# Patient Record
Sex: Female | Born: 1990
Health system: Southern US, Community
[De-identification: ages and names within clinical notes are randomized; demographics above are authoritative.]

## PROBLEM LIST (undated history)

## (undated) DIAGNOSIS — F99 Mental disorder, not otherwise specified: Secondary | ICD-10-CM

## (undated) DIAGNOSIS — F309 Manic episode, unspecified: Secondary | ICD-10-CM

## (undated) DIAGNOSIS — J45909 Unspecified asthma, uncomplicated: Secondary | ICD-10-CM

## (undated) DIAGNOSIS — F32A Depression, unspecified: Secondary | ICD-10-CM

## (undated) HISTORY — PX: UMBILICAL HERNIA REPAIR: SHX196

## (undated) HISTORY — PX: TONSILECTOMY, ADENOIDECTOMY, BILATERAL MYRINGOTOMY AND TUBES: SHX2538

---
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
## 2009-11-13 ENCOUNTER — Ambulatory Visit (HOSPITAL_COMMUNITY): Admission: RE | Admit: 2009-11-13 | Discharge: 2009-11-13 | Payer: Self-pay | Admitting: Family Medicine

## 2009-11-13 ENCOUNTER — Emergency Department (HOSPITAL_COMMUNITY): Admission: EM | Admit: 2009-11-13 | Discharge: 2009-11-13 | Payer: Self-pay | Admitting: Family Medicine

## 2010-01-05 ENCOUNTER — Inpatient Hospital Stay (HOSPITAL_COMMUNITY): Admission: AD | Admit: 2010-01-05 | Discharge: 2010-01-05 | Payer: Self-pay | Admitting: Obstetrics and Gynecology

## 2010-03-19 ENCOUNTER — Inpatient Hospital Stay (HOSPITAL_COMMUNITY)
Admission: AD | Admit: 2010-03-19 | Discharge: 2010-03-20 | Payer: Self-pay | Source: Home / Self Care | Attending: Obstetrics & Gynecology | Admitting: Obstetrics & Gynecology

## 2010-05-28 LAB — URINALYSIS, ROUTINE W REFLEX MICROSCOPIC
Glucose, UA: NEGATIVE mg/dL
Ketones, ur: NEGATIVE mg/dL
Leukocytes, UA: NEGATIVE
Urobilinogen, UA: 1 mg/dL (ref 0.0–1.0)
pH: 6 (ref 5.0–8.0)

## 2010-05-28 LAB — URINE MICROSCOPIC-ADD ON

## 2010-05-30 LAB — URINALYSIS, ROUTINE W REFLEX MICROSCOPIC
Bilirubin Urine: NEGATIVE
Glucose, UA: NEGATIVE mg/dL
Hgb urine dipstick: NEGATIVE
Ketones, ur: 15 mg/dL — AB

## 2010-05-30 LAB — POCT PREGNANCY, URINE: Preg Test, Ur: NEGATIVE

## 2010-06-01 LAB — POCT PREGNANCY, URINE: Preg Test, Ur: NEGATIVE

## 2010-06-01 LAB — POCT URINALYSIS DIPSTICK
Glucose, UA: NEGATIVE mg/dL
Nitrite: NEGATIVE
pH: 6.5 (ref 5.0–8.0)

## 2010-07-16 ENCOUNTER — Inpatient Hospital Stay (HOSPITAL_COMMUNITY)
Admission: AD | Admit: 2010-07-16 | Discharge: 2010-07-17 | Disposition: A | Discharge status: Home / Self Care | Payer: Self-pay | Source: Ambulatory Visit | Attending: Obstetrics & Gynecology | Admitting: Obstetrics & Gynecology

## 2010-07-16 DIAGNOSIS — A499 Bacterial infection, unspecified: Secondary | ICD-10-CM | POA: Insufficient documentation

## 2010-07-16 DIAGNOSIS — N760 Acute vaginitis: Secondary | ICD-10-CM | POA: Insufficient documentation

## 2010-07-16 DIAGNOSIS — N949 Unspecified condition associated with female genital organs and menstrual cycle: Secondary | ICD-10-CM

## 2010-07-16 DIAGNOSIS — B9689 Other specified bacterial agents as the cause of diseases classified elsewhere: Secondary | ICD-10-CM | POA: Insufficient documentation

## 2010-07-16 LAB — POCT PREGNANCY, URINE: Preg Test, Ur: NEGATIVE

## 2010-07-17 LAB — URINALYSIS, ROUTINE W REFLEX MICROSCOPIC
Glucose, UA: NEGATIVE mg/dL
Hgb urine dipstick: NEGATIVE
Ketones, ur: NEGATIVE mg/dL
Specific Gravity, Urine: >1.03 — ABNORMAL HIGH (ref 1.005–1.030)

## 2010-07-17 LAB — WET PREP, GENITAL: Trich, Wet Prep: NONE SEEN

## 2010-07-17 LAB — GC/CHLAMYDIA PROBE AMP, GENITAL
Chlamydia, DNA Probe: NEGATIVE
GC Probe Amp, Genital: NEGATIVE

## 2010-07-27 ENCOUNTER — Inpatient Hospital Stay (HOSPITAL_COMMUNITY)
Admission: AD | Admit: 2010-07-27 | Discharge: 2010-07-27 | Disposition: A | Discharge status: Home / Self Care | Payer: Self-pay | Source: Ambulatory Visit | Attending: Obstetrics and Gynecology | Admitting: Obstetrics and Gynecology

## 2010-07-27 DIAGNOSIS — R109 Unspecified abdominal pain: Secondary | ICD-10-CM

## 2010-07-27 DIAGNOSIS — N760 Acute vaginitis: Secondary | ICD-10-CM

## 2010-07-27 DIAGNOSIS — A499 Bacterial infection, unspecified: Secondary | ICD-10-CM

## 2010-07-27 DIAGNOSIS — K5900 Constipation, unspecified: Secondary | ICD-10-CM | POA: Insufficient documentation

## 2010-07-27 DIAGNOSIS — B9689 Other specified bacterial agents as the cause of diseases classified elsewhere: Secondary | ICD-10-CM | POA: Insufficient documentation

## 2010-07-27 LAB — URINALYSIS, ROUTINE W REFLEX MICROSCOPIC
Glucose, UA: NEGATIVE mg/dL
Hgb urine dipstick: NEGATIVE
Specific Gravity, Urine: 1.02 (ref 1.005–1.030)
pH: 7 (ref 5.0–8.0)

## 2010-07-27 LAB — CBC
Hemoglobin: 11.4 g/dL — ABNORMAL LOW (ref 12.0–15.0)
MCH: 31.1 pg (ref 26.0–34.0)
MCV: 96.4 fL (ref 78.0–100.0)
RBC: 3.66 MIL/uL — ABNORMAL LOW (ref 3.87–5.11)
WBC: 5.9 10*3/uL (ref 4.0–10.5)

## 2010-07-27 LAB — DIFFERENTIAL
Basophils Relative: 0 % (ref 0–1)
Lymphs Abs: 3.4 10*3/uL (ref 0.7–4.0)
Monocytes Relative: 9 % (ref 3–12)
Neutro Abs: 1.8 10*3/uL (ref 1.7–7.7)
Neutrophils Relative %: 31 % — ABNORMAL LOW (ref 43–77)
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
## 2010-08-03 NOTE — Discharge Summary (Signed)
Christina David, Christina David             ACCOUNT NO.:  [id number]   MEDICAL RECORD NO.:  [id number]          PATIENT TYPE:  EMS   LOCATION:  ED                           FACILITY[facility]  [facility]   PHYSICIAN:  Dyann Ruddle, MDDATE OF BIRTH:  14-Jun-1990   DATE OF ADMISSION:  03/19/2006  DATE OF DISCHARGE:  03/20/2006                               DISCHARGE SUMMARY   REASON FOR HOSPITALIZATION:  Pelvic inflammatory disease with positive  Gonorrhea, possible urinary tract infection.   SIGNIFICANT FINDINGS:  [age]-year-old female with history of bipolar  disease and asthma, presenting with 2 week history of lower abdominal  and pelvic pain.  Prior visits to the emergency department revealed a CT  scan from March 11, 2006, which showed mild colonic wall thickening,  but without pelvic abnormality.  GC probe from March 14, 2006  returned positive for [facility] and negative for Chlamydia.  Urinalysis on  March 19, 2006, positive leukocyte esterase and positive nitrite with  30 protein.   TREATMENT:  Monitored abdominal pain, plus strict measurement of ins and  outs.  Continued home Flagyl prescription and home Cipro prescription  for pelvic inflammatory disease treatment.  IV fluids for hydration on  admission.   OPERATION/PROCEDURE:  None.   FINAL DIAGNOSES:  1. Pelvic inflammatory disease secondary to Gonorrhea.  2. Possible urinary tract infection.   DISCHARGE MEDICATIONS AND INSTRUCTIONS:  1. Flagyl 500 mg p.o. b.i.d. for 8 days.  2. Cipro 500 mg p.o. b.i.d. for 8 days.  3. Vicodin 5/500 one tab p.o. q.6 hours p.r.n.   PENDING RESULTS AND ISSUES TO BE FOLLOWED:  Urine culture, RPR and HIV  test.   FOLLOWUP:  [facility], scheduled for March 21, 2006 at 9:45 a.m.   DISCHARGE WEIGHT:  68 kilograms.   DISCHARGE CONDITION:  Improved.          ______________________________  Dyann Ruddle, MD    LSP/MEDQ  D:  03/20/2006  T:  03/20/2006  Job:  [id number]   cc:   [facility]

## 2010-09-08 ENCOUNTER — Inpatient Hospital Stay (HOSPITAL_COMMUNITY)
Admission: AD | Admit: 2010-09-08 | Discharge: 2010-09-08 | Disposition: A | Discharge status: Home / Self Care | Payer: Self-pay | Source: Ambulatory Visit | Attending: Obstetrics & Gynecology | Admitting: Obstetrics & Gynecology

## 2010-09-08 DIAGNOSIS — N898 Other specified noninflammatory disorders of vagina: Secondary | ICD-10-CM

## 2010-09-08 DIAGNOSIS — F319 Bipolar disorder, unspecified: Secondary | ICD-10-CM | POA: Insufficient documentation

## 2010-09-08 DIAGNOSIS — L03119 Cellulitis of unspecified part of limb: Secondary | ICD-10-CM | POA: Insufficient documentation

## 2010-09-08 DIAGNOSIS — L0231 Cutaneous abscess of buttock: Secondary | ICD-10-CM

## 2010-09-08 DIAGNOSIS — L02419 Cutaneous abscess of limb, unspecified: Secondary | ICD-10-CM

## 2010-09-08 DIAGNOSIS — L03317 Cellulitis of buttock: Secondary | ICD-10-CM

## 2010-09-08 DIAGNOSIS — J45909 Unspecified asthma, uncomplicated: Secondary | ICD-10-CM | POA: Insufficient documentation

## 2010-09-08 LAB — WET PREP, GENITAL: Yeast Wet Prep HPF POC: NONE SEEN

## 2010-09-10 LAB — POCT PREGNANCY, URINE: Preg Test, Ur: NEGATIVE

## 2010-10-31 ENCOUNTER — Inpatient Hospital Stay (HOSPITAL_COMMUNITY)
Admission: RE | Admit: 2010-10-31 | Discharge: 2010-10-31 | Disposition: A | Discharge status: Home / Self Care | Payer: Medicaid Other | Source: Ambulatory Visit | Attending: Family Medicine | Admitting: Family Medicine

## 2010-11-15 ENCOUNTER — Inpatient Hospital Stay (HOSPITAL_COMMUNITY)
Admission: AD | Admit: 2010-11-15 | Discharge: 2010-11-15 | Discharge status: Against Medical Advice | Payer: Medicaid Other | Source: Ambulatory Visit | Attending: Obstetrics & Gynecology | Admitting: Obstetrics & Gynecology

## 2010-11-15 ENCOUNTER — Encounter (HOSPITAL_COMMUNITY): Payer: Self-pay | Admitting: *Deleted

## 2010-11-15 DIAGNOSIS — B9689 Other specified bacterial agents as the cause of diseases classified elsewhere: Secondary | ICD-10-CM | POA: Insufficient documentation

## 2010-11-15 DIAGNOSIS — A499 Bacterial infection, unspecified: Secondary | ICD-10-CM | POA: Insufficient documentation

## 2010-11-15 DIAGNOSIS — N760 Acute vaginitis: Secondary | ICD-10-CM | POA: Insufficient documentation

## 2010-11-15 LAB — URINALYSIS, ROUTINE W REFLEX MICROSCOPIC
Bilirubin Urine: NEGATIVE
Glucose, UA: NEGATIVE mg/dL
Ketones, ur: NEGATIVE mg/dL
Leukocytes, UA: NEGATIVE
Protein, ur: NEGATIVE mg/dL

## 2010-11-15 LAB — WET PREP, GENITAL

## 2010-11-15 LAB — CBC
HCT: 34.8 % — ABNORMAL LOW (ref 36.0–46.0)
Hemoglobin: 11.4 g/dL — ABNORMAL LOW (ref 12.0–15.0)
MCH: 31.5 pg (ref 26.0–34.0)
MCHC: 32.8 g/dL (ref 30.0–36.0)
MCV: 96.1 fL (ref 78.0–100.0)
RDW: 12.7 % (ref 11.5–15.5)

## 2010-11-15 LAB — URINE MICROSCOPIC-ADD ON

## 2010-11-15 LAB — GC/CHLAMYDIA PROBE AMP, GENITAL: Chlamydia, DNA Probe: NEGATIVE

## 2010-11-15 LAB — POCT PREGNANCY, URINE: Preg Test, Ur: NEGATIVE

## 2010-11-15 MED ORDER — ONDANSETRON 8 MG PO TBDP
8.0000 mg | ORAL_TABLET | Freq: Once | ORAL | Status: DC
  Filled 2010-11-15: qty 1

## 2010-11-15 MED ORDER — IBUPROFEN 600 MG PO TABS
600.0000 mg | ORAL_TABLET | Freq: Once | ORAL | Status: AC
  Administered 2010-11-15: 600 mg via ORAL
  Filled 2010-11-15: qty 1

## 2010-11-15 NOTE — ED Provider Notes (Signed)
History     Chief Complaint  Patient presents with  . Vaginal Bleeding   HPI Bleeding all month, spotting and clots, nausea and burping 2-3 times/day for 2 weeks. Headache x 1.5 hours. Hasn't tried any meds for nausea or headache. Had rice, shrimp, steak and chicken hibachi tonight. Drinking water. Periods usually regular.   OB History    Grav Para Term Preterm Abortions TAB SAB Ect Mult Living   0               No past medical history on file.  Past Surgical History  Procedure Date  . Umbilical hernia repair   . Tonsilectomy, adenoidectomy, bilateral myringotomy and tubes     Family History  Problem Relation Age of Onset  . Hypertension Mother     History  Substance Use Topics  . Smoking status: Current Some Day Smoker -- 1.0 packs/day  . Smokeless tobacco: Not on file  . Alcohol Use: No    Allergies: Allergies not on file  No prescriptions prior to admission    Review of Systems  Constitutional: Negative.   Respiratory: Negative.   Cardiovascular: Negative.   Gastrointestinal: Positive for nausea. Negative for vomiting, abdominal pain, diarrhea and constipation.  Genitourinary: Negative for dysuria, urgency, frequency, hematuria and flank pain.       Negative vaginal discharge, dyspareunia, positive for spotting   Musculoskeletal: Negative.   Neurological: Negative.   Psychiatric/Behavioral: Negative.    Physical Exam   Blood pressure 121/80, pulse 67, temperature 98.8 F (37.1 C), resp. rate 20, height 5\' 5"  (1.651 m), weight 77.565 kg (171 lb), last menstrual period 10/17/2010.  Physical Exam  Constitutional: She is oriented to person, place, and time. She appears well-developed and well-nourished. No distress.  HENT:  Head: Normocephalic and atraumatic.  Cardiovascular: Normal rate, regular rhythm and normal heart sounds.   Respiratory: Effort normal and breath sounds normal. No respiratory distress.  GI: Soft. Bowel sounds are normal. She exhibits  no distension and no mass. There is no tenderness. There is no rebound and no guarding.  Genitourinary: There is no rash or lesion on the right labia. There is no rash or lesion on the left labia. Uterus is not deviated, not enlarged, not fixed and not tender. Cervix exhibits no motion tenderness, no discharge and no friability. Right adnexum displays no mass, no tenderness and no fullness. Left adnexum displays no mass, no tenderness and no fullness. No erythema, tenderness or bleeding around the vagina. No vaginal discharge found.  Neurological: She is alert and oriented to person, place, and time.  Skin: Skin is warm and dry.  Psychiatric: She has a normal mood and affect.    MAU Course  Procedures  Results for orders placed during the hospital encounter of 11/15/10 (from the past 24 hour(s))  URINALYSIS, ROUTINE W REFLEX MICROSCOPIC     Status: Abnormal   Collection Time   11/15/10  1:15 AM      Component Value Range   Color, Urine YELLOW  YELLOW    Appearance CLEAR  CLEAR    Specific Gravity, Urine 1.020  1.005 - 1.030    pH 7.0  5.0 - 8.0    Glucose, UA NEGATIVE  NEGATIVE (mg/dL)   Hgb urine dipstick TRACE (*) NEGATIVE    Bilirubin Urine NEGATIVE  NEGATIVE    Ketones, ur NEGATIVE  NEGATIVE (mg/dL)   Protein, ur NEGATIVE  NEGATIVE (mg/dL)   Urobilinogen, UA 1.0  0.0 - 1.0 (mg/dL)  Nitrite NEGATIVE  NEGATIVE    Leukocytes, UA NEGATIVE  NEGATIVE   URINE MICROSCOPIC-ADD ON     Status: Abnormal   Collection Time   11/15/10  1:15 AM      Component Value Range   Squamous Epithelial / LPF FEW (*) RARE    WBC, UA 3-6  <3 (WBC/hpf)   RBC / HPF 0-2  <3 (RBC/hpf)   Bacteria, UA RARE  RARE    Urine-Other MUCOUS PRESENT    POCT PREGNANCY, URINE     Status: Normal   Collection Time   11/15/10  1:32 AM      Component Value Range   Preg Test, Ur NEGATIVE    CBC     Status: Abnormal   Collection Time   11/15/10  1:35 AM      Component Value Range   WBC 7.7  4.0 - 10.5 (K/uL)   RBC 3.62  (*) 3.87 - 5.11 (MIL/uL)   Hemoglobin 11.4 (*) 12.0 - 15.0 (g/dL)   HCT 40.9 (*) 81.1 - 46.0 (%)   MCV 96.1  78.0 - 100.0 (fL)   MCH 31.5  26.0 - 34.0 (pg)   MCHC 32.8  30.0 - 36.0 (g/dL)   RDW 91.4  78.2 - 95.6 (%)   Platelets 211  150 - 400 (K/uL)    Motrin ordered for headache, Zofran ODT for nausea   Assessment and Plan  20 y.o. female with irregular bleeding and BV Pt left AMA  prior to wet prep results and prior to getting Zofran  FRAZIER,NATALIE 11/15/2010, 1:36 AM

## 2010-11-15 NOTE — Progress Notes (Signed)
Pt states she thinks she had a regular period at the beginning of Aug.-states has been bleeding all month and has been nauseated-uses no birth control-states she is unable to gett pregnant-does not know why

## 2010-11-15 NOTE — Progress Notes (Signed)
Pt states she has been bleeding for 3 wks.Pt states her bleeding is spotting and clots. Pt denies cramping.

## 2011-01-03 LAB — POCT URINALYSIS DIP (DEVICE)
Bilirubin Urine: NEGATIVE
Glucose, UA: NEGATIVE
Ketones, ur: 15 — AB
Nitrite: NEGATIVE
Specific Gravity, Urine: 1.015
pH: 6.5

## 2011-01-03 LAB — DIFFERENTIAL
Basophils Absolute: 0
Basophils Relative: 0
Eosinophils Absolute: 0.1
Eosinophils Relative: 1
Lymphocytes Relative: 27
Lymphs Abs: 2.4
Monocytes Absolute: 1
Monocytes Relative: 11 — ABNORMAL HIGH
Neutro Abs: 5.3
Neutrophils Relative %: 61

## 2011-01-03 LAB — CBC
HCT: 33.7 — ABNORMAL LOW
Hemoglobin: 11.2 — ABNORMAL LOW
MCHC: 33.2
MCV: 93.4
Platelets: 314
RBC: 3.61 — ABNORMAL LOW
RDW: 13.3
WBC: 8.8

## 2011-01-03 LAB — GC/CHLAMYDIA PROBE AMP, GENITAL
Chlamydia, DNA Probe: POSITIVE — AB
GC Probe Amp, Genital: NEGATIVE

## 2011-01-03 LAB — WET PREP, GENITAL
Trich, Wet Prep: NONE SEEN
Yeast Wet Prep HPF POC: NONE SEEN

## 2011-01-03 LAB — POCT PREGNANCY, URINE
Operator id: 235561
Preg Test, Ur: NEGATIVE

## 2011-01-21 ENCOUNTER — Encounter: Payer: Medicaid Other | Admitting: Obstetrics and Gynecology

## 2011-12-30 ENCOUNTER — Inpatient Hospital Stay (HOSPITAL_COMMUNITY)
Admission: AD | Admit: 2011-12-30 | Discharge: 2011-12-30 | Disposition: A | Discharge status: Home / Self Care | Payer: Medicaid Other | Source: Ambulatory Visit | Attending: Obstetrics and Gynecology | Admitting: Obstetrics and Gynecology

## 2011-12-30 DIAGNOSIS — N938 Other specified abnormal uterine and vaginal bleeding: Secondary | ICD-10-CM | POA: Insufficient documentation

## 2011-12-30 DIAGNOSIS — N921 Excessive and frequent menstruation with irregular cycle: Secondary | ICD-10-CM

## 2011-12-30 DIAGNOSIS — N923 Ovulation bleeding: Secondary | ICD-10-CM

## 2011-12-30 DIAGNOSIS — N949 Unspecified condition associated with female genital organs and menstrual cycle: Secondary | ICD-10-CM | POA: Insufficient documentation

## 2011-12-30 LAB — URINALYSIS, ROUTINE W REFLEX MICROSCOPIC
Ketones, ur: 15 mg/dL — AB
Leukocytes, UA: NEGATIVE
Nitrite: NEGATIVE
Protein, ur: 30 mg/dL — AB

## 2011-12-30 LAB — URINE MICROSCOPIC-ADD ON

## 2011-12-30 LAB — WET PREP, GENITAL: Yeast Wet Prep HPF POC: NONE SEEN

## 2011-12-30 LAB — POCT PREGNANCY, URINE: Preg Test, Ur: NEGATIVE

## 2011-12-30 NOTE — MAU Provider Note (Signed)
History     CSN: [id number]  Arrival date and time: 12/30/11 1641   None     Chief Complaint  Patient presents with  . Vaginal Bleeding   HPI 21 y.o. G0P0 with intermenstrual bleeding for the last 2 months. Patient's last menstrual period was 12/15/2011. Bled x 4 days, then stopped, started spotting again on 10/10. Same pattern last month. No pain.    No past medical history on file.  Past Surgical History  Procedure Date  . Umbilical hernia repair   . Tonsilectomy, adenoidectomy, bilateral myringotomy and tubes     Family History  Problem Relation Age of Onset  . Hypertension Mother     History  Substance Use Topics  . Smoking status: Current Some Day Smoker -- 1.0 packs/day  . Smokeless tobacco: Not on file  . Alcohol Use: No    Allergies:  Allergies  Allergen Reactions  . Trileptal (Oxcarbazepine)     Hives, swelling, and hadaches    Prescriptions prior to admission  Medication Sig Dispense Refill  . Aspirin-Salicylamide-Caffeine (BC HEADACHE POWDER PO) Take by mouth. Headache        Review of Systems  Constitutional: Negative.   Respiratory: Negative.   Cardiovascular: Negative.   Gastrointestinal: Negative for nausea, vomiting, abdominal pain, diarrhea and constipation.  Genitourinary: Negative for dysuria, urgency, frequency, hematuria and flank pain.       Positive for spotting  Musculoskeletal: Negative.   Neurological: Negative.   Psychiatric/Behavioral: Negative.    Physical Exam   Blood pressure 116/64, pulse 73, temperature 98.2 F (36.8 C), temperature source Oral, resp. rate 18, height 5\' 5"  (1.651 m), weight 166 lb 12.8 oz (75.66 kg), last menstrual period 12/15/2011.  Physical Exam  Nursing note and vitals reviewed. Constitutional: She is oriented to person, place, and time. She appears well-developed and well-nourished. No distress.  HENT:  Head: Normocephalic and atraumatic.  Cardiovascular: Normal rate and regular rhythm.     Respiratory: Effort normal. No respiratory distress.  GI: Soft. Bowel sounds are normal. She exhibits no distension and no mass. There is no tenderness. There is no rebound and no guarding.  Genitourinary: There is no rash or lesion on the right labia. There is no rash or lesion on the left labia. Uterus is not deviated, not enlarged, not fixed and not tender. Cervix exhibits discharge (blood tinged egg white cervical mucous). Cervix exhibits no motion tenderness and no friability. Right adnexum displays no mass, no tenderness and no fullness. Left adnexum displays no mass, no tenderness and no fullness. No erythema or tenderness around the vagina. No vaginal discharge found.  Neurological: She is alert and oriented to person, place, and time.  Skin: Skin is warm and dry.  Psychiatric: She has a normal mood and affect.    MAU Course  Procedures  Results for orders placed during the hospital encounter of 12/30/11 (from the past 24 hour(s))  URINALYSIS, ROUTINE W REFLEX MICROSCOPIC     Status: Abnormal   Collection Time   12/30/11  4:55 PM      Component Value Range   Color, Urine YELLOW  YELLOW   APPearance CLEAR  CLEAR   Specific Gravity, Urine 1.025  1.005 - 1.030   pH 6.5  5.0 - 8.0   Glucose, UA NEGATIVE  NEGATIVE mg/dL   Hgb urine dipstick NEGATIVE  NEGATIVE   Bilirubin Urine NEGATIVE  NEGATIVE   Ketones, ur 15 (*) NEGATIVE mg/dL   Protein, ur 30 (*) NEGATIVE mg/dL  Urobilinogen, UA 1.0  0.0 - 1.0 mg/dL   Nitrite NEGATIVE  NEGATIVE   Leukocytes, UA NEGATIVE  NEGATIVE  URINE MICROSCOPIC-ADD ON     Status: Normal   Collection Time   12/30/11  4:55 PM      Component Value Range   Squamous Epithelial / LPF RARE  RARE   WBC, UA 0-2  <3 WBC/hpf   RBC / HPF 0-2  <3 RBC/hpf   Bacteria, UA RARE  RARE   Urine-Other MUCOUS PRESENT    POCT PREGNANCY, URINE     Status: Normal   Collection Time   12/30/11  5:14 PM      Component Value Range   Preg Test, Ur NEGATIVE  NEGATIVE  WET  PREP, GENITAL     Status: Abnormal   Collection Time   12/30/11  5:52 PM      Component Value Range   Yeast Wet Prep HPF POC NONE SEEN  NONE SEEN   Trich, Wet Prep NONE SEEN  NONE SEEN   Clue Cells Wet Prep HPF POC FEW (*) NONE SEEN   WBC, Wet Prep HPF POC FEW (*) NONE SEEN     Assessment and Plan   1. Intermenstrual bleeding       Medication List     As of 01/01/2012  3:45 PM    CONTINUE taking these medications         BC HEADACHE POWDER PO            Follow-up Information    Follow up with [facility]. (As needed)    Contact information:   [address] [place] [address] [phone]           Florencia Zaccaro 12/30/2011, 5:24 PM

## 2011-12-30 NOTE — MAU Note (Signed)
States has been bleeding for 3-4 days in addition to her period which ended 10/4. States she did this last month also. States she has never been pregnant and has not been using birth control.

## 2012-01-01 NOTE — MAU Provider Note (Signed)
Attestation of Attending Supervision of Advanced Practitioner (CNM/NP): Evaluation and management procedures were performed by the Advanced Practitioner under my supervision and collaboration.  I have reviewed the Advanced Practitioner's note and chart, and I agree with the management and plan.  [name] 01/01/2012 4:05 PM

## 2012-04-10 ENCOUNTER — Emergency Department (HOSPITAL_COMMUNITY)
Admission: EM | Admit: 2012-04-10 | Discharge: 2012-04-10 | Disposition: A | Discharge status: Home / Self Care | Payer: Self-pay | Attending: Emergency Medicine | Admitting: Emergency Medicine

## 2012-04-10 DIAGNOSIS — K089 Disorder of teeth and supporting structures, unspecified: Secondary | ICD-10-CM | POA: Insufficient documentation

## 2012-04-10 DIAGNOSIS — F17200 Nicotine dependence, unspecified, uncomplicated: Secondary | ICD-10-CM | POA: Insufficient documentation

## 2012-04-10 DIAGNOSIS — Z79899 Other long term (current) drug therapy: Secondary | ICD-10-CM | POA: Insufficient documentation

## 2012-04-10 DIAGNOSIS — Z7982 Long term (current) use of aspirin: Secondary | ICD-10-CM | POA: Insufficient documentation

## 2012-04-10 DIAGNOSIS — K0889 Other specified disorders of teeth and supporting structures: Secondary | ICD-10-CM

## 2012-04-10 MED ORDER — PENICILLIN V POTASSIUM 500 MG PO TABS: 500.0000 mg | ORAL_TABLET | Freq: Three times a day (TID) | ORAL | Status: DC

## 2012-04-10 MED ORDER — BUPIVACAINE HCL (PF) 0.5 % IJ SOLN
10.0000 mL | Freq: Once | INTRAMUSCULAR | Status: DC
  Filled 2012-04-10: qty 10

## 2012-04-10 MED ORDER — OXYCODONE-ACETAMINOPHEN 5-325 MG PO TABS
2.0000 | ORAL_TABLET | Freq: Once | ORAL | Status: AC
  Administered 2012-04-10: 2 via ORAL
  Filled 2012-04-10: qty 2

## 2012-04-10 MED ORDER — OXYCODONE-ACETAMINOPHEN 5-325 MG PO TABS: 2.0000 | ORAL_TABLET | ORAL | Status: DC | PRN

## 2012-04-10 NOTE — ED Provider Notes (Signed)
Medical screening examination/treatment/procedure(s) were performed by non-physician practitioner and as supervising physician I was immediately available for consultation/collaboration.  Gilda Crease, MD 04/10/12 [phone]

## 2012-04-10 NOTE — ED Notes (Signed)
PA at bedside.

## 2012-04-10 NOTE — ED Provider Notes (Signed)
History     CSN: [id number]  Arrival date & time 04/10/12  9562   First MD Initiated Contact with Patient 04/10/12 0740      Chief Complaint  Patient presents with  . Dental Pain    (Consider location/radiation/quality/duration/timing/severity/associated sxs/prior treatment) HPI Comments: The patient is a 22 year old otherwise healthy female who presents with dental pain that started gradually 3 days ago. The dental pain is severe, constant and progressively worsening. The pain is aching and located in left lower jaw. The pain does not radiate. Eating makes the pain worse. Nothing makes the pain better. The patient has tried aspirin, Goody powder, and ibuprofen for pain without relief. No associated symptoms. Patient denies headache, neck pain/stiffness, fever, NVD, edema, sore throat, throat swelling, wheezing, SOB, chest pain, abdominal pain.      No past medical history on file.  Past Surgical History  Procedure Date  . Umbilical hernia repair   . Tonsilectomy, adenoidectomy, bilateral myringotomy and tubes     Family History  Problem Relation Age of Onset  . Hypertension Mother     History  Substance Use Topics  . Smoking status: Current Some Day Smoker -- 1.0 packs/day  . Smokeless tobacco: Not on file  . Alcohol Use: No    OB History    Grav Para Term Preterm Abortions TAB SAB Ect Mult Living   0               Review of Systems  HENT: Positive for dental problem.   All other systems reviewed and are negative.    Allergies  Trileptal  Home Medications   Current Outpatient Rx  Name  Route  Sig  Dispense  Refill  . ALBUTEROL SULFATE HFA 108 (90 BASE) MCG/ACT IN AERS   Inhalation   Inhale 2 puffs into the lungs every 6 (six) hours as needed. For shortness of breath         . ASPIRIN 500 MG PO TBEC   Oral   Take 1,000 mg by mouth every 6 (six) hours as needed. For pain         . BC HEADACHE POWDER PO   Oral   Take by mouth. Headache         . IBUPROFEN 200 MG PO TABS   Oral   Take 400 mg by mouth every 6 (six) hours as needed. For pain         . NAPROXEN PO   Oral   Take 1 tablet by mouth daily as needed. For pain         . NAPROXEN SODIUM 220 MG PO TABS   Oral   Take 440 mg by mouth 2 (two) times daily as needed. For pain           BP 126/95  Pulse 51  Temp 98.3 F (36.8 C) (Oral)  Resp 16  SpO2 100%  LMP 03/19/2012  Physical Exam  Nursing note and vitals reviewed. Constitutional: She is oriented to person, place, and time. She appears well-developed and well-nourished. No distress.  HENT:  Head: Normocephalic and atraumatic.       Good dentition. Teeth non tender to percussion.   Eyes: Conjunctivae normal are normal.  Neck: Normal range of motion. Neck supple.  Cardiovascular: Normal rate and regular rhythm.  Exam reveals no gallop and no friction rub.   No murmur heard. Pulmonary/Chest: Effort normal and breath sounds normal. She has no wheezes. She has no rales. She  exhibits no tenderness.  Abdominal: Soft. There is no tenderness.  Musculoskeletal: Normal range of motion.  Neurological: She is alert and oriented to person, place, and time. Coordination normal.       Speech is goal-oriented. Moves limbs without ataxia.   Skin: Skin is warm and dry.  Psychiatric: She has a normal mood and affect. Her behavior is normal.    ED Course  Dental Block Date/Time: 04/10/2012 8:16 AM Performed by: Emilia Beck Authorized by: Emilia Beck Consent: Verbal consent obtained. Risks and benefits: risks, benefits and alternatives were discussed Consent given by: patient Site marked: the operative site was marked Patient identity confirmed: verbally with patient Patient sedated: no Patient tolerance: Patient tolerated the procedure well with no immediate complications. Comments: I injected the tooth root with marcaine.    (including critical care time)    Labs Reviewed - No data to  display No results found.   1. Pain, dental       MDM  7:57 AM Patient given percocet for pain. I will do a dental block.   8:24 AM Patient reports relief after dental block. Patient will be discharged with Pen VK and Percocet. Patient will follow up with recommended dentist. Patient instructed to call within 48 hours to guarantee appointment. No further evaluation needed at this time. Vitals stable for discharge.      Emilia Beck, PA-C 04/10/12 [phone]

## 2012-04-10 NOTE — ED Notes (Signed)
Pt reports having a toothache x 3 days on the Left lower rear tooth.  States that nothing has helped the pain.  No distress noted.  Pt is tearful at the present.

## 2012-06-21 ENCOUNTER — Encounter (HOSPITAL_COMMUNITY): Payer: Self-pay | Admitting: Emergency Medicine

## 2012-06-21 ENCOUNTER — Emergency Department (HOSPITAL_COMMUNITY)
Admission: EM | Admit: 2012-06-21 | Discharge: 2012-06-21 | Disposition: A | Discharge status: Home / Self Care | Payer: No Typology Code available for payment source | Attending: Emergency Medicine | Admitting: Emergency Medicine

## 2012-06-21 DIAGNOSIS — Z79899 Other long term (current) drug therapy: Secondary | ICD-10-CM | POA: Insufficient documentation

## 2012-06-21 DIAGNOSIS — IMO0002 Reserved for concepts with insufficient information to code with codable children: Secondary | ICD-10-CM | POA: Insufficient documentation

## 2012-06-21 DIAGNOSIS — F17200 Nicotine dependence, unspecified, uncomplicated: Secondary | ICD-10-CM | POA: Insufficient documentation

## 2012-06-21 DIAGNOSIS — T148XXA Other injury of unspecified body region, initial encounter: Secondary | ICD-10-CM

## 2012-06-21 DIAGNOSIS — Y92410 Unspecified street and highway as the place of occurrence of the external cause: Secondary | ICD-10-CM | POA: Insufficient documentation

## 2012-06-21 DIAGNOSIS — S0990XA Unspecified injury of head, initial encounter: Secondary | ICD-10-CM | POA: Insufficient documentation

## 2012-06-21 DIAGNOSIS — S0993XA Unspecified injury of face, initial encounter: Secondary | ICD-10-CM | POA: Insufficient documentation

## 2012-06-21 DIAGNOSIS — Z7982 Long term (current) use of aspirin: Secondary | ICD-10-CM | POA: Insufficient documentation

## 2012-06-21 DIAGNOSIS — S199XXA Unspecified injury of neck, initial encounter: Secondary | ICD-10-CM | POA: Insufficient documentation

## 2012-06-21 DIAGNOSIS — Y9389 Activity, other specified: Secondary | ICD-10-CM | POA: Insufficient documentation

## 2012-06-21 MED ORDER — NAPROXEN 500 MG PO TABS
500.0000 mg | ORAL_TABLET | Freq: Once | ORAL | Status: AC
  Administered 2012-06-21: 500 mg via ORAL
  Filled 2012-06-21: qty 1

## 2012-06-21 MED ORDER — METHOCARBAMOL 500 MG PO TABS: 1000.0000 mg | ORAL_TABLET | Freq: Four times a day (QID) | ORAL | Status: DC

## 2012-06-21 MED ORDER — NAPROXEN 500 MG PO TABS: 500.0000 mg | ORAL_TABLET | Freq: Two times a day (BID) | ORAL | Status: DC
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
## 2012-06-21 NOTE — ED Provider Notes (Signed)
Medical screening examination/treatment/procedure(s) were performed by non-physician practitioner and as supervising physician I was immediately available for consultation/collaboration.   Rollin Kotowski H Breindel Collier, MD 06/21/12 2213 
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
## 2012-08-03 ENCOUNTER — Encounter (HOSPITAL_COMMUNITY): Payer: Self-pay | Admitting: Emergency Medicine

## 2012-08-03 ENCOUNTER — Emergency Department (INDEPENDENT_AMBULATORY_CARE_PROVIDER_SITE_OTHER)
Admission: EM | Admit: 2012-08-03 | Discharge: 2012-08-03 | Disposition: A | Discharge status: Home / Self Care | Payer: Self-pay | Source: Home / Self Care | Attending: Family Medicine | Admitting: Family Medicine

## 2012-08-03 DIAGNOSIS — S0510XA Contusion of eyeball and orbital tissues, unspecified eye, initial encounter: Secondary | ICD-10-CM

## 2012-08-03 DIAGNOSIS — S0512XA Contusion of eyeball and orbital tissues, left eye, initial encounter: Secondary | ICD-10-CM

## 2012-08-03 HISTORY — DX: Unspecified asthma, uncomplicated: J45.909

## 2012-08-03 MED ORDER — TETRACAINE HCL 0.5 % OP SOLN
OPHTHALMIC | Status: AC
  Filled 2012-08-03: qty 2
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
## 2012-08-03 NOTE — ED Notes (Signed)
Notified david of patient
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
## 2012-08-03 NOTE — ED Notes (Signed)
Placed eye equipment at bedside

## 2013-02-22 ENCOUNTER — Inpatient Hospital Stay (HOSPITAL_COMMUNITY): Payer: Medicaid Other

## 2013-02-22 ENCOUNTER — Encounter (HOSPITAL_COMMUNITY): Payer: Self-pay

## 2013-02-22 ENCOUNTER — Inpatient Hospital Stay (HOSPITAL_COMMUNITY)
Admission: AD | Admit: 2013-02-22 | Discharge: 2013-02-22 | Disposition: A | Discharge status: Home / Self Care | Payer: Self-pay | Source: Ambulatory Visit | Attending: Obstetrics & Gynecology | Admitting: Obstetrics & Gynecology

## 2013-02-22 DIAGNOSIS — A499 Bacterial infection, unspecified: Secondary | ICD-10-CM | POA: Insufficient documentation

## 2013-02-22 DIAGNOSIS — B9689 Other specified bacterial agents as the cause of diseases classified elsewhere: Secondary | ICD-10-CM | POA: Insufficient documentation

## 2013-02-22 DIAGNOSIS — N946 Dysmenorrhea, unspecified: Secondary | ICD-10-CM | POA: Insufficient documentation

## 2013-02-22 DIAGNOSIS — R0982 Postnasal drip: Secondary | ICD-10-CM | POA: Insufficient documentation

## 2013-02-22 DIAGNOSIS — R109 Unspecified abdominal pain: Secondary | ICD-10-CM | POA: Insufficient documentation

## 2013-02-22 DIAGNOSIS — N760 Acute vaginitis: Secondary | ICD-10-CM | POA: Insufficient documentation

## 2013-02-22 HISTORY — DX: Mental disorder, not otherwise specified: F99

## 2013-02-22 LAB — URINE MICROSCOPIC-ADD ON

## 2013-02-22 LAB — WET PREP, GENITAL
WBC, Wet Prep HPF POC: NONE SEEN
Yeast Wet Prep HPF POC: NONE SEEN

## 2013-02-22 LAB — CBC
HCT: 33.9 % — ABNORMAL LOW (ref 36.0–46.0)
Hemoglobin: 11.4 g/dL — ABNORMAL LOW (ref 12.0–15.0)
MCH: 31.9 pg (ref 26.0–34.0)
MCV: 95 fL (ref 78.0–100.0)
RBC: 3.57 MIL/uL — ABNORMAL LOW (ref 3.87–5.11)

## 2013-02-22 LAB — URINALYSIS, ROUTINE W REFLEX MICROSCOPIC
Bilirubin Urine: NEGATIVE
Specific Gravity, Urine: 1.025 (ref 1.005–1.030)
Urobilinogen, UA: 1 mg/dL (ref 0.0–1.0)

## 2013-02-22 MED ORDER — METRONIDAZOLE 500 MG PO TABS: 500.0000 mg | ORAL_TABLET | Freq: Two times a day (BID) | ORAL | Status: DC

## 2013-02-22 NOTE — MAU Note (Signed)
Has had heavy bleeding with blood clots for past two days, lower abd pain bilaterally. Having frequency and urgency with voiding. Did have thicker vaginal discharge prior to bleeding. Pain is intermittent and noted for past two days as well.

## 2013-02-22 NOTE — MAU Note (Signed)
Patient states she has been having lower abdominal pain and abnormal bleeding for about 2 weeks.

## 2013-02-22 NOTE — MAU Provider Note (Signed)
History     CSN: [id number]  Arrival date and time: 02/22/13 4098   First Provider Initiated Contact with Patient 02/22/13 [phone]      Chief Complaint  Patient presents with  . Possible Pregnancy  . Vaginal Bleeding  . Abdominal Pain   HPI Christina David is a 22 y.o. G0P0 who presents to MAU today with complaint of lower abdominal pain and vaginal bleeding. The patient states that she has had lower abdominal pain x 2 weeks and heavy bleeding x 2 days with clots. The patient states that she has felt dizzy, tired, weak and nauseous x 2 weeks. She has only had one episode of vomiting. She has not taken HPT. She denies changes in her appetite, fever or UTI symptoms. The patient states that she had some white discharge prior to the bleeding starting. The patient also has concerns about sore throat x 2-3 days. She has had some nasal congestion and cough without chest congestion or fever.   OB History   Grav Para Term Preterm Abortions TAB SAB Ect Mult Living   0               Past Medical History  Diagnosis Date  . Asthma   . Mental disorder     Never had Md diagnosis    Past Surgical History  Procedure Laterality Date  . Umbilical hernia repair    . Tonsilectomy, adenoidectomy, bilateral myringotomy and tubes      Family History  Problem Relation Age of Onset  . Hypertension Mother     History  Substance Use Topics  . Smoking status: Current Every Day Smoker -- 0.25 packs/day    Types: Cigarettes  . Smokeless tobacco: Never Used  . Alcohol Use: No    Allergies:  Allergies  Allergen Reactions  . Bee Venom Swelling  . Trileptal [Oxcarbazepine] Hives, Swelling and Other (See Comments)    Headaches     Prescriptions prior to admission  Medication Sig Dispense Refill  . ibuprofen (ADVIL,MOTRIN) 200 MG tablet Take 400 mg by mouth every 6 (six) hours as needed. For pain      . albuterol (PROVENTIL HFA;VENTOLIN HFA) 108 (90 BASE) MCG/ACT inhaler Inhale 2 puffs  into the lungs every 6 (six) hours as needed for wheezing or shortness of breath. For shortness of breath        Review of Systems  Constitutional: Positive for malaise/fatigue. Negative for fever.  Gastrointestinal: Positive for nausea and abdominal pain. Negative for vomiting, diarrhea and constipation.  Genitourinary: Negative for dysuria, urgency, frequency and hematuria.       + vaginal bleeding, discharge  Neurological: Positive for dizziness and weakness. Negative for loss of consciousness.   Physical Exam   Blood pressure 124/93, pulse 107, temperature 98.2 F (36.8 C), temperature source Oral, resp. rate 18, height 5\' 6"  (1.676 m), weight 161 lb 3.2 oz (73.12 kg), last menstrual period 02/20/2013.  Physical Exam  Constitutional: She is oriented to person, place, and time. She appears well-developed and well-nourished. No distress.  HENT:  Head: Normocephalic and atraumatic.  Right Ear: Tympanic membrane, external ear and ear canal normal.  Left Ear: Tympanic membrane, external ear and ear canal normal.  Nose: Mucosal edema and rhinorrhea present. No epistaxis.  Mouth/Throat: Oropharynx is clear and moist and mucous membranes are normal. No oropharyngeal exudate, posterior oropharyngeal edema or posterior oropharyngeal erythema.  Cardiovascular: Normal rate, regular rhythm and normal heart sounds.   Respiratory: Effort normal and breath  sounds normal. No respiratory distress.  GI: Soft. Bowel sounds are normal. She exhibits no distension and no mass. There is tenderness (mild lower abdominal tenderness to palpation). There is no rebound and no guarding.  Genitourinary: Uterus is not enlarged and not tender. Cervix exhibits no motion tenderness, no discharge and no friability. Right adnexum displays no mass and no tenderness. Left adnexum displays no mass and no tenderness. There is bleeding (small amount of blood noted vaginally) around the vagina. No vaginal discharge found.   Neurological: She is alert and oriented to person, place, and time.  Skin: Skin is warm and dry. No erythema.  Psychiatric: She has a normal mood and affect.   Results for orders placed during the hospital encounter of 02/22/13 (from the past 24 hour(s))  URINALYSIS, ROUTINE W REFLEX MICROSCOPIC     Status: Abnormal   Collection Time    02/22/13  7:50 AM      Result Value Range   Color, Urine YELLOW  YELLOW   APPearance CLEAR  CLEAR   Specific Gravity, Urine 1.025  1.005 - 1.030   pH 6.0  5.0 - 8.0   Glucose, UA NEGATIVE  NEGATIVE mg/dL   Hgb urine dipstick LARGE (*) NEGATIVE   Bilirubin Urine NEGATIVE  NEGATIVE   Ketones, ur 15 (*) NEGATIVE mg/dL   Protein, ur NEGATIVE  NEGATIVE mg/dL   Urobilinogen, UA 1.0  0.0 - 1.0 mg/dL   Nitrite NEGATIVE  NEGATIVE   Leukocytes, UA NEGATIVE  NEGATIVE  URINE MICROSCOPIC-ADD ON     Status: None   Collection Time    02/22/13  7:50 AM      Result Value Range   Squamous Epithelial / LPF RARE  RARE   WBC, UA 0-2  <3 WBC/hpf   RBC / HPF 21-50  <3 RBC/hpf   Urine-Other MUCOUS PRESENT    POCT PREGNANCY, URINE     Status: None   Collection Time    02/22/13  8:05 AM      Result Value Range   Preg Test, Ur NEGATIVE  NEGATIVE  WET PREP, GENITAL     Status: None   Collection Time    02/22/13  8:41 AM      Result Value Range   Yeast Wet Prep HPF POC NONE SEEN  NONE SEEN   Trich, Wet Prep NONE SEEN  NONE SEEN   Clue Cells Wet Prep HPF POC NONE SEEN  NONE SEEN   WBC, Wet Prep HPF POC NONE SEEN  NONE SEEN  CBC     Status: Abnormal   Collection Time    02/22/13  8:50 AM      Result Value Range   WBC 10.8 (*) 4.0 - 10.5 K/uL   RBC 3.57 (*) 3.87 - 5.11 MIL/uL   Hemoglobin 11.4 (*) 12.0 - 15.0 g/dL   HCT 40.9 (*) 81.1 - 91.4 %   MCV 95.0  78.0 - 100.0 fL   MCH 31.9  26.0 - 34.0 pg   MCHC 33.6  30.0 - 36.0 g/dL   RDW 78.2  95.6 - 21.3 %   Platelets 229  150 - 400 K/uL   US Transvaginal Non-ob  02/22/2013   CLINICAL DATA:  Dysfunctional  uterine bleeding. Lower abdominal pain.  EXAM: TRANSABDOMINAL AND TRANSVAGINAL ULTRASOUND OF PELVIS  TECHNIQUE: Both transabdominal and transvaginal ultrasound examinations of the pelvis were performed. Transabdominal technique was performed for global imaging of the pelvis including uterus, ovaries, adnexal regions, and pelvic cul-de-sac. It  was necessary to proceed with endovaginal exam following the transabdominal exam to visualize the uterus, endometrium, ovaries and adnexa .  COMPARISON:  11/13/2009  FINDINGS: Uterus  Measurements: 7.3 x 3.8 x 4.8 cm. No fibroids or other mass visualized.  Endometrium  Thickness: Normal in thickness, 4 mm. No focal abnormality visualized.  Right ovary  Measurements: 3.8 x 2.3 x 1.9 cm. Normal appearance/no adnexal mass.  Left ovary  Measurements: 3.2 x 1.8 x 2.6 cm. Normal appearance/no adnexal mass.  Other findings  No free fluid.  IMPRESSION: Unremarkable study.   Electronically Signed   By: Charlett Nose M.D.   On: 02/22/2013 10:26   US Pelvis Complete  02/22/2013   CLINICAL DATA:  Dysfunctional uterine bleeding. Lower abdominal pain.  EXAM: TRANSABDOMINAL AND TRANSVAGINAL ULTRASOUND OF PELVIS  TECHNIQUE: Both transabdominal and transvaginal ultrasound examinations of the pelvis were performed. Transabdominal technique was performed for global imaging of the pelvis including uterus, ovaries, adnexal regions, and pelvic cul-de-sac. It was necessary to proceed with endovaginal exam following the transabdominal exam to visualize the uterus, endometrium, ovaries and adnexa .  COMPARISON:  11/13/2009  FINDINGS: Uterus  Measurements: 7.3 x 3.8 x 4.8 cm. No fibroids or other mass visualized.  Endometrium  Thickness: Normal in thickness, 4 mm. No focal abnormality visualized.  Right ovary  Measurements: 3.8 x 2.3 x 1.9 cm. Normal appearance/no adnexal mass.  Left ovary  Measurements: 3.2 x 1.8 x 2.6 cm. Normal appearance/no adnexal mass.  Other findings  No free fluid.   IMPRESSION: Unremarkable study.   Electronically Signed   By: Charlett Nose M.D.   On: 02/22/2013 10:26    MAU Course  Procedures None  MDM UPT - negative UA, Wet prep, GC/Chlamydia, CBC and [place] today Patient is hemodynamically stable Normal [place] today Assessment and Plan  A: Dysmenorrhea Bacterial vaginosis Post nasal drip, viral etiology  P: Discharge home Rx for Flagyl sent to patient's pharmacy Patient advised to take Ibuprofen PRN for pain Patient advised to try Mucinex D, Claritin D or Zyrtec D for nasal congestion and sore throat.  Bleeding precautions discussed Patient may return to MAU as needed or if her condition were to change or worsen   Freddi Starr, PA-C  02/22/2013, 10:35 AM

## 2013-09-19 ENCOUNTER — Inpatient Hospital Stay (HOSPITAL_COMMUNITY)
Admission: AD | Admit: 2013-09-19 | Discharge: 2013-09-19 | Disposition: A | Discharge status: Home / Self Care | Payer: Self-pay | Source: Ambulatory Visit | Attending: Obstetrics and Gynecology | Admitting: Obstetrics and Gynecology

## 2013-09-19 ENCOUNTER — Encounter (HOSPITAL_COMMUNITY): Payer: Self-pay | Admitting: *Deleted

## 2013-09-19 DIAGNOSIS — N938 Other specified abnormal uterine and vaginal bleeding: Secondary | ICD-10-CM | POA: Insufficient documentation

## 2013-09-19 DIAGNOSIS — F17200 Nicotine dependence, unspecified, uncomplicated: Secondary | ICD-10-CM | POA: Insufficient documentation

## 2013-09-19 DIAGNOSIS — N925 Other specified irregular menstruation: Secondary | ICD-10-CM | POA: Insufficient documentation

## 2013-09-19 DIAGNOSIS — N949 Unspecified condition associated with female genital organs and menstrual cycle: Secondary | ICD-10-CM | POA: Insufficient documentation

## 2013-09-19 LAB — URINE MICROSCOPIC-ADD ON

## 2013-09-19 LAB — CBC
HEMATOCRIT: 35.3 % — AB (ref 36.0–46.0)
Hemoglobin: 12.1 g/dL (ref 12.0–15.0)
MCH: 33.3 pg (ref 26.0–34.0)
MCHC: 34.3 g/dL (ref 30.0–36.0)
MCV: 97.2 fL (ref 78.0–100.0)
Platelets: 240 10*3/uL (ref 150–400)
RBC: 3.63 MIL/uL — ABNORMAL LOW (ref 3.87–5.11)
RDW: 12.8 % (ref 11.5–15.5)
WBC: 4.8 10*3/uL (ref 4.0–10.5)

## 2013-09-19 LAB — URINALYSIS, ROUTINE W REFLEX MICROSCOPIC
Bilirubin Urine: NEGATIVE
Glucose, UA: NEGATIVE mg/dL
KETONES UR: NEGATIVE mg/dL
LEUKOCYTES UA: NEGATIVE
NITRITE: NEGATIVE
PROTEIN: 30 mg/dL — AB
Specific Gravity, Urine: >1.03 — ABNORMAL HIGH (ref 1.005–1.030)
UROBILINOGEN UA: 1 mg/dL (ref 0.0–1.0)
pH: 6 (ref 5.0–8.0)

## 2013-09-19 LAB — WET PREP, GENITAL
Trich, Wet Prep: NONE SEEN
WBC WET PREP: NONE SEEN
Yeast Wet Prep HPF POC: NONE SEEN

## 2013-09-19 LAB — POCT PREGNANCY, URINE: PREG TEST UR: NEGATIVE

## 2013-09-19 NOTE — Discharge Instructions (Signed)
Contraception Choices Contraception (birth control) is the use of any methods or devices to prevent pregnancy. Below are some methods to help avoid pregnancy. HORMONAL METHODS   Contraceptive implant. This is a thin, plastic tube containing progesterone hormone. It does not contain estrogen hormone. Your health care provider inserts the tube in the inner part of the upper arm. The tube can remain in place for up to 3 years. After 3 years, the implant must be removed. The implant prevents the ovaries from releasing an egg (ovulation), thickens the cervical mucus to prevent sperm from entering the uterus, and thins the lining of the inside of the uterus.  Progesterone-only injections. These injections are given every 3 months by your health care provider to prevent pregnancy. This synthetic progesterone hormone stops the ovaries from releasing eggs. It also thickens cervical mucus and changes the uterine lining. This makes it harder for sperm to survive in the uterus.  Birth control pills. These pills contain estrogen and progesterone hormone. They work by preventing the ovaries from releasing eggs (ovulation). They also cause the cervical mucus to thicken, preventing the sperm from entering the uterus. Birth control pills are prescribed by a health care provider.Birth control pills can also be used to treat heavy periods.  Minipill. This type of birth control pill contains only the progesterone hormone. They are taken every day of each month and must be prescribed by your health care provider.  Birth control patch. The patch contains hormones similar to those in birth control pills. It must be changed once a week and is prescribed by a health care provider.  Vaginal ring. The ring contains hormones similar to those in birth control pills. It is left in the vagina for 3 weeks, removed for 1 week, and then a new one is put back in place. The patient must be comfortable inserting and removing the ring  from the vagina.A health care provider's prescription is necessary.  Emergency contraception. Emergency contraceptives prevent pregnancy after unprotected sexual intercourse. This pill can be taken right after sex or up to 5 days after unprotected sex. It is most effective the sooner you take the pills after having sexual intercourse. Most emergency contraceptive pills are available without a prescription. Check with your pharmacist. Do not use emergency contraception as your only form of birth control. BARRIER METHODS   Female condom. This is a thin sheath (latex or rubber) that is worn over the penis during sexual intercourse. It can be used with spermicide to increase effectiveness.  Female condom. This is a soft, loose-fitting sheath that is put into the vagina before sexual intercourse.  Diaphragm. This is a soft, latex, dome-shaped barrier that must be fitted by a health care provider. It is inserted into the vagina, along with a spermicidal jelly. It is inserted before intercourse. The diaphragm should be left in the vagina for 6 to 8 hours after intercourse.  Cervical cap. This is a round, soft, latex or plastic cup that fits over the cervix and must be fitted by a health care provider. The cap can be left in place for up to 48 hours after intercourse.  Sponge. This is a soft, circular piece of polyurethane foam. The sponge has spermicide in it. It is inserted into the vagina after wetting it and before sexual intercourse.  Spermicides. These are chemicals that kill or block sperm from entering the cervix and uterus. They come in the form of creams, jellies, suppositories, foam, or tablets. They do not require a  prescription. They are inserted into the vagina with an applicator before having sexual intercourse. The process must be repeated every time you have sexual intercourse. INTRAUTERINE CONTRACEPTION  Intrauterine device (IUD). This is a T-shaped device that is put in a woman's uterus  during a menstrual period to prevent pregnancy. There are 2 types:  Copper IUD. This type of IUD is wrapped in copper wire and is placed inside the uterus. Copper makes the uterus and fallopian tubes produce a fluid that kills sperm. It can stay in place for 10 years.  Hormone IUD. This type of IUD contains the hormone progestin (synthetic progesterone). The hormone thickens the cervical mucus and prevents sperm from entering the uterus, and it also thins the uterine lining to prevent implantation of a fertilized egg. The hormone can weaken or kill the sperm that get into the uterus. It can stay in place for 3-5 years, depending on which type of IUD is used. PERMANENT METHODS OF CONTRACEPTION  Female tubal ligation. This is when the woman's fallopian tubes are surgically sealed, tied, or blocked to prevent the egg from traveling to the uterus.  Hysteroscopic sterilization. This involves placing a small coil or insert into each fallopian tube. Your doctor uses a technique called hysteroscopy to do the procedure. The device causes scar tissue to form. This results in permanent blockage of the fallopian tubes, so the sperm cannot fertilize the egg. It takes about 3 months after the procedure for the tubes to become blocked. You must use another form of birth control for these 3 months.  Female sterilization. This is when the female has the tubes that carry sperm tied off (vasectomy).This blocks sperm from entering the vagina during sexual intercourse. After the procedure, the man can still ejaculate fluid (semen). NATURAL PLANNING METHODS  Natural family planning. This is not having sexual intercourse or using a barrier method (condom, diaphragm, cervical cap) on days the woman could become pregnant.  Calendar method. This is keeping track of the length of each menstrual cycle and identifying when you are fertile.  Ovulation method. This is avoiding sexual intercourse during ovulation.  Symptothermal  method. This is avoiding sexual intercourse during ovulation, using a thermometer and ovulation symptoms.  Post-ovulation method. This is timing sexual intercourse after you have ovulated. Regardless of which type or method of contraception you choose, it is important that you use condoms to protect against the transmission of sexually transmitted infections (STIs). Talk with your health care provider about which form of contraception is most appropriate for you. Document Released: 03/04/2005 Document Revised: 03/09/2013 Document Reviewed: 08/27/2012 [facility] Patient Information 2015 [employer], [place]. This information is not intended to replace advice given to you by your health care provider. Make sure you discuss any questions you have with your health care provider. Emergency Contraception Emergency contraceptives prevent pregnancy after unprotected sexual intercourse. They can also be used:  When a condom breaks.  After a sexual assault.  If you forgot to take your birth control pills.  When inadequate protection occurs with sexual intercourse. Usually, emergency contraception is a pill or combination of pills taken right after sex or up to 5 days after unprotected sex. It is most effective the sooner you take the pills after having sexual intercourse. Most types of emergency contraceptive pills are available without a prescription. One type requires a prescription from your health care provider. Also, young women under age 25 need a prescription for most types of emergency contraception. Check with your pharmacist. Do not  use emergency contraception as your only form of birth control. These pills do not protect against sexually transmitted infections (STIs).  Emergency contraception will not work if you are already pregnant and will not harm the baby if you are pregnant. Emergency contraception does not cause an abortion. The pills work by preventing the ovaries from releasing an egg (ovulation)  or the fertilization of an egg. Taking St. John's wort, certain antibiotic medicines, and certain anticonvulsion medicines may make these pills less effective. Discuss with your health care provider the possible side effects of emergency contraceptives. These may include:  Abdominal pain and cramps.  Breast tenderness.  Headache.  Dizziness.  Fatigue.  Irregular bleeding or spotting. TYPES OF EMERGENCY CONTRACEPTIVES  Some types of emergency contraceptive pills contain estrogen and progesterone in higher doses.  Some types just contain progesterone. They are available as a single pill or two pills taken 12-24 hours apart.  One type of pill is not a hormone. It prevents the hormone progesterone from having its normal effect on ovulation and the lining of the uterus.  An intrauterine device (IUD) may be used.This T-shaped device is also used as a form of birth control. It is inserted into the uterus to prevent pregnancy. The copper IUD can also be used as emergency contraception if inserted within 5 days of having unprotected intercourse. HOME CARE INSTRUCTIONS   Eat something before taking the emergency contraceptive pills.  Lie down for a couple of hours if you become tired or dizzy.  Continue using birth control until you start your menstrual period. SEEK MEDICAL CARE IF:   You throw up (vomit) within 2 hours after taking the pill. You will have to take another pill.  You need treatment for nausea, vomiting, headache, or abdominal cramps.  You have not had a menstrual period 21 days after taking the pill.  You are having irregular bleeding or spotting. SEEK IMMEDIATE MEDICAL CARE IF:   You have chest pain.  You have leg pain.  You have numbness or weakness of your arms or legs.  You have slurred speech.  You have visual problems. Document Released: 05/13/2001 Document Revised: 12/23/2012 Document Reviewed: 08/16/2012 [facility] Patient Information 2015 [employer],  [place]. This information is not intended to replace advice given to you by your health care provider. Make sure you discuss any questions you have with your health care provider.

## 2013-09-19 NOTE — MAU Provider Note (Signed)
History     CSN: [id number]  Arrival date and time: 09/19/13 [id number]   First Provider Initiated Contact with Patient 09/19/13 2025      Chief Complaint  Patient presents with  . Vaginal Bleeding   HPI This is a 23 y.o. female who presents with vaginal bleeding.Pt states took Plan B on 6/13 after having unprotected sex with her boyfriend. Started her period on 6/21 & has had spotting since then. States bleeding is more like spotting or bloody discharge that ranges in color from bright red to brown. Has been using light tampons because she doesn't like pads, but only changes them a few times per day & hasn't saturated them. Reports foul odor with the bleeding x 2 weeks. Reports possibly seeing white mucous-like discharge mixed with the blood but is unsure. Denies vaginal irritation or abdominal pain. Uses condom for birth control. Has only been with 1 sexual partner within the last 6 months. States has negative STI screening 1 month ago and declines being screened again tonight.  Pt does not desire to be on hormonal birth control at this time; wishes to continue using condoms.   RN Note: Pt reports vaginal bleeding x 2 weeks. LMP started 06/21 and then stopped for 2 days and has been spotting since. States she used PlanB on 06/13.        OB History   Grav Para Term Preterm Abortions TAB SAB Ect Mult Living   0               Past Medical History  Diagnosis Date  . Asthma   . Mental disorder     Never had Md diagnosis    Past Surgical History  Procedure Laterality Date  . Umbilical hernia repair    . Tonsilectomy, adenoidectomy, bilateral myringotomy and tubes      Family History  Problem Relation Age of Onset  . Hypertension Mother     History  Substance Use Topics  . Smoking status: Current Every Day Smoker -- 0.25 packs/day    Types: Cigarettes  . Smokeless tobacco: Never Used  . Alcohol Use: No    Allergies:  Allergies  Allergen Reactions  . Bee Venom Swelling   . Trileptal [Oxcarbazepine] Hives, Swelling and Other (See Comments)    Headaches     Prescriptions prior to admission  Medication Sig Dispense Refill  . albuterol (PROVENTIL HFA;VENTOLIN HFA) 108 (90 BASE) MCG/ACT inhaler Inhale 2 puffs into the lungs every 6 (six) hours as needed for wheezing or shortness of breath.         Review of Systems  Constitutional: Negative.   Respiratory: Negative for shortness of breath.   Cardiovascular: Negative for chest pain.  Gastrointestinal: Negative.   Genitourinary: Negative for dysuria, urgency and frequency.       Positive for vaginal bleeding & odor. Negative for vaginal irritation.   Neurological: Negative for dizziness.  Endo/Heme/Allergies: Does not bruise/bleed easily.   Physical Exam   Blood pressure 120/78, pulse 79, temperature 98.1 F (36.7 C), temperature source Oral, resp. rate 18, height 5\' 6"  (1.676 m), weight 178 lb (80.74 kg), last menstrual period 09/05/2013, SpO2 100.00%.  Physical Exam  Constitutional: She is oriented to person, place, and time. She appears well-developed and well-nourished.  Cardiovascular: Normal rate and regular rhythm.   No murmur heard. Respiratory: Effort normal and breath sounds normal. No respiratory distress.  GI: Soft. She exhibits no distension and no mass. There is no tenderness. There is no  rebound and no guarding.  Genitourinary: Vagina normal.  Neurological: She is alert and oriented to person, place, and time.  Psychiatric: She has a normal mood and affect. Her behavior is normal. Judgment and thought content normal.    MAU Course  Procedures Results for orders placed during the hospital encounter of 09/19/13 (from the past 24 hour(s))  URINALYSIS, ROUTINE W REFLEX MICROSCOPIC     Status: Abnormal   Collection Time    09/19/13  7:18 PM      Result Value Ref Range   Color, Urine YELLOW  YELLOW   APPearance CLEAR  CLEAR   Specific Gravity, Urine >1.030 (*) 1.005 - 1.030   pH 6.0   5.0 - 8.0   Glucose, UA NEGATIVE  NEGATIVE mg/dL   Hgb urine dipstick LARGE (*) NEGATIVE   Bilirubin Urine NEGATIVE  NEGATIVE   Ketones, ur NEGATIVE  NEGATIVE mg/dL   Protein, ur 30 (*) NEGATIVE mg/dL   Urobilinogen, UA 1.0  0.0 - 1.0 mg/dL   Nitrite NEGATIVE  NEGATIVE   Leukocytes, UA NEGATIVE  NEGATIVE  URINE MICROSCOPIC-ADD ON     Status: Abnormal   Collection Time    09/19/13  7:18 PM      Result Value Ref Range   Squamous Epithelial / LPF RARE  RARE   WBC, UA 0-2  <3 WBC/hpf   RBC / HPF 7-10  <3 RBC/hpf   Bacteria, UA FEW (*) RARE   Urine-Other MUCOUS PRESENT    POCT PREGNANCY, URINE     Status: None   Collection Time    09/19/13  7:25 PM      Result Value Ref Range   Preg Test, Ur NEGATIVE  NEGATIVE  CBC     Status: Abnormal   Collection Time    09/19/13  7:37 PM      Result Value Ref Range   WBC 4.8  4.0 - 10.5 K/uL   RBC 3.63 (*) 3.87 - 5.11 MIL/uL   Hemoglobin 12.1  12.0 - 15.0 g/dL   HCT 96.035.3 (*) 45.436.0 - 09.846.0 %   MCV 97.2  78.0 - 100.0 fL   MCH 33.3  26.0 - 34.0 pg   MCHC 34.3  30.0 - 36.0 g/dL   RDW 11.912.8  14.711.5 - 82.915.5 %   Platelets 240  150 - 400 K/uL  WET PREP, GENITAL     Status: Abnormal   Collection Time    09/19/13  8:50 PM      Result Value Ref Range   Yeast Wet Prep HPF POC NONE SEEN  NONE SEEN   Trich, Wet Prep NONE SEEN  NONE SEEN   Clue Cells Wet Prep HPF POC FEW (*) NONE SEEN   WBC, Wet Prep HPF POC NONE SEEN  NONE SEEN     MDM Pt declines STI testing tonight.   Assessment and Plan  A: 1. DUB (dysfunctional uterine bleeding)    P: Discharge home in stable condition.  Pt given list of Gyn providers per request.  Discussed reasons to return to MAU Provided information about contraception.   Claudie Reveringrin B Lawrence, Student-NP 09/19/2013 9:22 PM  Seen also by me Agree with note Aviva SignsMarie L Doyce Stonehouse, CNM

## 2013-09-19 NOTE — MAU Note (Signed)
Pt reports vaginal bleeding x 2 weeks. LMP started 06/21 and then stopped for 2 days and has been spotting since. States she used PlanB on 06/13.

## 2013-09-20 NOTE — MAU Provider Note (Signed)
Attestation of Attending Supervision of Advanced Practitioner (CNM/NP): Evaluation and management procedures were performed by the Advanced Practitioner under my supervision and collaboration.  I have reviewed the Advanced Practitioner's note and chart, and I agree with the management and plan.  Tamsen Reist 09/20/2013 7:34 AM

## 2013-10-04 ENCOUNTER — Emergency Department (HOSPITAL_COMMUNITY)
Admission: EM | Admit: 2013-10-04 | Discharge: 2013-10-04 | Disposition: A | Discharge status: Home / Self Care | Payer: No Typology Code available for payment source | Attending: Emergency Medicine | Admitting: Emergency Medicine

## 2013-10-04 ENCOUNTER — Encounter (HOSPITAL_COMMUNITY): Payer: Self-pay | Admitting: Emergency Medicine

## 2013-10-04 ENCOUNTER — Emergency Department (HOSPITAL_COMMUNITY): Payer: No Typology Code available for payment source

## 2013-10-04 DIAGNOSIS — J45909 Unspecified asthma, uncomplicated: Secondary | ICD-10-CM | POA: Insufficient documentation

## 2013-10-04 DIAGNOSIS — S298XXA Other specified injuries of thorax, initial encounter: Secondary | ICD-10-CM | POA: Insufficient documentation

## 2013-10-04 DIAGNOSIS — S161XXA Strain of muscle, fascia and tendon at neck level, initial encounter: Secondary | ICD-10-CM

## 2013-10-04 DIAGNOSIS — S335XXA Sprain of ligaments of lumbar spine, initial encounter: Secondary | ICD-10-CM | POA: Insufficient documentation

## 2013-10-04 DIAGNOSIS — S39012A Strain of muscle, fascia and tendon of lower back, initial encounter: Secondary | ICD-10-CM

## 2013-10-04 DIAGNOSIS — H9319 Tinnitus, unspecified ear: Secondary | ICD-10-CM | POA: Insufficient documentation

## 2013-10-04 DIAGNOSIS — Y92410 Unspecified street and highway as the place of occurrence of the external cause: Secondary | ICD-10-CM | POA: Insufficient documentation

## 2013-10-04 DIAGNOSIS — Z79899 Other long term (current) drug therapy: Secondary | ICD-10-CM | POA: Insufficient documentation

## 2013-10-04 DIAGNOSIS — F17200 Nicotine dependence, unspecified, uncomplicated: Secondary | ICD-10-CM | POA: Insufficient documentation

## 2013-10-04 DIAGNOSIS — Y9389 Activity, other specified: Secondary | ICD-10-CM | POA: Insufficient documentation

## 2013-10-04 DIAGNOSIS — Z8659 Personal history of other mental and behavioral disorders: Secondary | ICD-10-CM | POA: Insufficient documentation

## 2013-10-04 DIAGNOSIS — S46909A Unspecified injury of unspecified muscle, fascia and tendon at shoulder and upper arm level, unspecified arm, initial encounter: Secondary | ICD-10-CM | POA: Insufficient documentation

## 2013-10-04 DIAGNOSIS — Z3202 Encounter for pregnancy test, result negative: Secondary | ICD-10-CM | POA: Insufficient documentation

## 2013-10-04 DIAGNOSIS — S4980XA Other specified injuries of shoulder and upper arm, unspecified arm, initial encounter: Secondary | ICD-10-CM | POA: Insufficient documentation

## 2013-10-04 DIAGNOSIS — S139XXA Sprain of joints and ligaments of unspecified parts of neck, initial encounter: Secondary | ICD-10-CM | POA: Insufficient documentation

## 2013-10-04 LAB — POC URINE PREG, ED: PREG TEST UR: NEGATIVE

## 2013-10-04 MED ORDER — IBUPROFEN 600 MG PO TABS: 600.0000 mg | ORAL_TABLET | Freq: Four times a day (QID) | ORAL | Status: DC | PRN

## 2013-10-04 MED ORDER — HYDROCODONE-ACETAMINOPHEN 5-325 MG PO TABS: 1.0000 | ORAL_TABLET | Freq: Four times a day (QID) | ORAL | Status: DC | PRN

## 2013-10-04 NOTE — ED Provider Notes (Signed)
Medical screening examination/treatment/procedure(s) were performed by non-physician practitioner and as supervising physician I was immediately available for consultation/collaboration.   EKG Interpretation None        Lino Wickliff, MD 10/04/13 2327 
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
## 2014-08-17 ENCOUNTER — Encounter (HOSPITAL_COMMUNITY): Payer: Self-pay

## 2014-08-17 ENCOUNTER — Inpatient Hospital Stay (HOSPITAL_COMMUNITY)
Admission: AD | Admit: 2014-08-17 | Discharge: 2014-08-17 | Disposition: A | Discharge status: Home / Self Care | Payer: Self-pay | Source: Ambulatory Visit | Attending: Family Medicine | Admitting: Family Medicine

## 2014-08-17 DIAGNOSIS — F17210 Nicotine dependence, cigarettes, uncomplicated: Secondary | ICD-10-CM | POA: Insufficient documentation

## 2014-08-17 DIAGNOSIS — N760 Acute vaginitis: Secondary | ICD-10-CM | POA: Insufficient documentation

## 2014-08-17 DIAGNOSIS — A499 Bacterial infection, unspecified: Secondary | ICD-10-CM

## 2014-08-17 DIAGNOSIS — B9689 Other specified bacterial agents as the cause of diseases classified elsewhere: Secondary | ICD-10-CM

## 2014-08-17 DIAGNOSIS — N939 Abnormal uterine and vaginal bleeding, unspecified: Secondary | ICD-10-CM | POA: Insufficient documentation

## 2014-08-17 LAB — URINALYSIS, ROUTINE W REFLEX MICROSCOPIC
BILIRUBIN URINE: NEGATIVE
Glucose, UA: NEGATIVE mg/dL
Ketones, ur: NEGATIVE mg/dL
LEUKOCYTES UA: NEGATIVE
Nitrite: NEGATIVE
PH: 6 (ref 5.0–8.0)
Protein, ur: NEGATIVE mg/dL
Specific Gravity, Urine: >1.03 — ABNORMAL HIGH (ref 1.005–1.030)
Urobilinogen, UA: 0.2 mg/dL (ref 0.0–1.0)

## 2014-08-17 LAB — URINE MICROSCOPIC-ADD ON: WBC UA: NONE SEEN WBC/hpf (ref <3)

## 2014-08-17 LAB — WET PREP, GENITAL
TRICH WET PREP: NONE SEEN
WBC, Wet Prep HPF POC: NONE SEEN
YEAST WET PREP: NONE SEEN

## 2014-08-17 LAB — GC/CHLAMYDIA PROBE AMP (~~LOC~~) NOT AT ARMC
Chlamydia: NEGATIVE
Neisseria Gonorrhea: NEGATIVE

## 2014-08-17 LAB — POCT PREGNANCY, URINE: Preg Test, Ur: NEGATIVE

## 2014-08-17 MED ORDER — METRONIDAZOLE 0.75 % VA GEL: 1.0000 | Freq: Two times a day (BID) | VAGINAL | Status: DC

## 2014-08-17 NOTE — Discharge Instructions (Signed)
Bacterial Vaginosis Bacterial vaginosis is a vaginal infection that occurs when the normal balance of bacteria in the vagina is disrupted. It results from an overgrowth of certain bacteria. This is the most common vaginal infection in women of childbearing age. Treatment is important to prevent complications, especially in pregnant women, as it can cause a premature delivery. CAUSES  Bacterial vaginosis is caused by an increase in harmful bacteria that are normally present in smaller amounts in the vagina. Several different kinds of bacteria can cause bacterial vaginosis. However, the reason that the condition develops is not fully understood. RISK FACTORS Certain activities or behaviors can put you at an increased risk of developing bacterial vaginosis, including:  Having a new sex partner or multiple sex partners.  Douching.  Using an intrauterine device (IUD) for contraception. Women do not get bacterial vaginosis from toilet seats, bedding, swimming pools, or contact with objects around them. SIGNS AND SYMPTOMS  Some women with bacterial vaginosis have no signs or symptoms. Common symptoms include:  Grey vaginal discharge.  A fishlike odor with discharge, especially after sexual intercourse.  Itching or burning of the vagina and vulva.  Burning or pain with urination. DIAGNOSIS  Your health care provider will take a medical history and examine the vagina for signs of bacterial vaginosis. A sample of vaginal fluid may be taken. Your health care provider will look at this sample under a microscope to check for bacteria and abnormal cells. A vaginal pH test may also be done.  TREATMENT  Bacterial vaginosis may be treated with antibiotic medicines. These may be given in the form of a pill or a vaginal cream. A second round of antibiotics may be prescribed if the condition comes back after treatment.  HOME CARE INSTRUCTIONS   Only take over-the-counter or prescription medicines as  directed by your health care provider.  If antibiotic medicine was prescribed, take it as directed. Make sure you finish it even if you start to feel better.  Do not have sex until treatment is completed.  Tell all sexual partners that you have a vaginal infection. They should see their health care provider and be treated if they have problems, such as a mild rash or itching.  Practice safe sex by using condoms and only having one sex partner. SEEK MEDICAL CARE IF:   Your symptoms are not improving after 3 days of treatment.  You have increased discharge or pain.  You have a fever. MAKE SURE YOU:   Understand these instructions.  Will watch your condition.  Will get help right away if you are not doing well or get worse. FOR MORE INFORMATION  Centers for Disease Control and Prevention, Division of STD Prevention: www.cdc.gov/std American Sexual Health Association (ASHA): www.ashastd.org  Document Released: 03/04/2005 Document Revised: 12/23/2012 Document Reviewed: 10/14/2012 ExitCare Patient Information 2015 [employer]. This information is not intended to replace advice given to you by your health care provider. Make sure you discuss any questions you have with your health care provider.  

## 2014-08-17 NOTE — MAU Provider Note (Signed)
History     CSN: [id number]  Arrival date and time: 08/17/14 0300   First Provider Initiated Contact with Patient 08/17/14 [phone]      Chief Complaint  Patient presents with  . Vaginal Bleeding  . Vaginal Discharge   HPI  Ms. Christina David is a 24 y.o. G0P0 who presents to MAU today with complaint of vaginal discharge and bleeding x 3 weeks. The patient states that she was seen at [facility] and treated for BV. She states that she completed the entire Rx for Flagyl and continues to have spotting and malodorous discharge. She states discharge is thick and white when there is not blood. She states occasional mild pelvic cramping. She has not taken anything for pain. She denies N/V/D, UTI symptoms or fever.   OB History    Gravida Para Term Preterm AB TAB SAB Ectopic Multiple Living   0               Past Medical History  Diagnosis Date  . Asthma   . Mental disorder     Never had Md diagnosis    Past Surgical History  Procedure Laterality Date  . Umbilical hernia repair    . Tonsilectomy, adenoidectomy, bilateral myringotomy and tubes      Family History  Problem Relation Age of Onset  . Hypertension Mother     History  Substance Use Topics  . Smoking status: Current Every Day Smoker -- 0.25 packs/day    Types: Cigarettes  . Smokeless tobacco: Never Used  . Alcohol Use: No    Allergies:  Allergies  Allergen Reactions  . Bee Venom Swelling  . Trileptal [Oxcarbazepine] Hives, Swelling and Other (See Comments)    Headaches     Prescriptions prior to admission  Medication Sig Dispense Refill Last Dose  . albuterol (PROVENTIL HFA;VENTOLIN HFA) 108 (90 BASE) MCG/ACT inhaler Inhale 2 puffs into the lungs every 6 (six) hours as needed for wheezing or shortness of breath.    10/02/2013   . HYDROcodone-acetaminophen (NORCO/VICODIN) 5-325 MG per tablet Take 1-2 tablets by mouth every 6 (six) hours as needed for moderate pain or severe pain. 10 tablet 0   . ibuprofen  (ADVIL,MOTRIN) 600 MG tablet Take 1 tablet (600 mg total) by mouth every 6 (six) hours as needed. 30 tablet 0     Review of Systems  Constitutional: Negative for fever and malaise/fatigue.  Gastrointestinal: Positive for abdominal pain. Negative for nausea, vomiting, diarrhea and constipation.  Genitourinary: Negative for dysuria, urgency and frequency.       + vaginal bleeding, discharge   Physical Exam   Blood pressure 125/86, pulse 80, temperature 97.8 F (36.6 C), temperature source Oral, resp. rate 16, height  (1.676 m), weight 192 lb (87.091 kg), last menstrual period 07/09/2014, SpO2 99 %.  Physical Exam  Nursing note and vitals reviewed. Constitutional: She is oriented to person, place, and time. She appears well-developed and well-nourished. No distress.  HENT:  Head: Normocephalic and atraumatic.  Cardiovascular: Normal rate.   Respiratory: Effort normal.  GI: Soft. She exhibits no distension and no mass. There is no tenderness. There is no rebound and no guarding.  Genitourinary: Uterus is not enlarged and not tender. Cervix exhibits no motion tenderness, no discharge and no friability. Right adnexum displays no mass and no tenderness. Left adnexum displays no mass and no tenderness. There is bleeding (scant blood noted at cervical os) in the vagina. No vaginal discharge found.  Neurological: She is  alert and oriented to person, place, and time.  Skin: Skin is warm and dry. No erythema.  Psychiatric: She has a normal mood and affect.   Results for orders placed or performed during the hospital encounter of 08/17/14 (from the past 24 hour(s))  Urinalysis, Routine w reflex microscopic     Status: Abnormal   Collection Time: 08/17/14  3:14 AM  Result Value Ref Range   Color, Urine YELLOW YELLOW   APPearance CLEAR CLEAR   Specific Gravity, Urine >1.030 (H) 1.005 - 1.030   pH 6.0 5.0 - 8.0   Glucose, UA NEGATIVE NEGATIVE mg/dL   Hgb urine dipstick TRACE (A) NEGATIVE    Bilirubin Urine NEGATIVE NEGATIVE   Ketones, ur NEGATIVE NEGATIVE mg/dL   Protein, ur NEGATIVE NEGATIVE mg/dL   Urobilinogen, UA 0.2 0.0 - 1.0 mg/dL   Nitrite NEGATIVE NEGATIVE   Leukocytes, UA NEGATIVE NEGATIVE  Urine microscopic-add on     Status: Abnormal   Collection Time: 08/17/14  3:14 AM  Result Value Ref Range   Squamous Epithelial / LPF RARE RARE   WBC, UA  <3 WBC/hpf    NO FORMED ELEMENTS SEEN ON URINE MICROSCOPIC EXAMINATION   RBC / HPF 0-2 <3 RBC/hpf   Bacteria, UA FEW (A) RARE  Wet prep, genital     Status: Abnormal   Collection Time: 08/17/14  3:30 AM  Result Value Ref Range   Yeast Wet Prep HPF POC NONE SEEN NONE SEEN   Trich, Wet Prep NONE SEEN NONE SEEN   Clue Cells Wet Prep HPF POC FEW (A) NONE SEEN   WBC, Wet Prep HPF POC NONE SEEN NONE SEEN  Pregnancy, urine POC     Status: None   Collection Time: 08/17/14  3:32 AM  Result Value Ref Range   Preg Test, Ur NEGATIVE NEGATIVE    MAU Course  Procedures None  MDM UPT - negative UA, wet prep, GC/Chalmydia Patient declines HIV stating just tested 2 weeks ago  Assessment and Plan  A: Bacterial vaginosis Abnormal uterine bleeding  P: Discharge home Rx for Metrogel given to patient Hygiene products discussed to avoid reinfection Patient advised to follow-up with [facility] for further work-up of AUB Patient may return to MAU as needed or if her condition were to change or worsen   Marny LowensteinJulie N Wenzel, PA-C  08/17/2014, 3:55 AM

## 2014-08-17 NOTE — MAU Note (Signed)
Pt reports she went to the STD clinic 3 weeks ago because she was having spotting and a vaginal discharge. States they gave her meds but she is still having symptoms.

## 2014-08-23 ENCOUNTER — Other Ambulatory Visit: Payer: Self-pay | Admitting: Advanced Practice Midwife

## 2014-08-23 MED ORDER — METRONIDAZOLE 500 MG PO TABS: 500.0000 mg | ORAL_TABLET | Freq: Two times a day (BID) | ORAL | Status: DC

## 2014-08-23 NOTE — Progress Notes (Signed)
Requested oral flagyl due to inability to afford gel Sent to pharmacy

## 2015-03-07 IMAGING — CR DG CHEST 2V
2 series · 2 of 2 positions shown · non-contrast
Comparison: None.

CLINICAL DATA: Chest pain

EXAM:
CHEST  2 VIEW

[w chest pa]
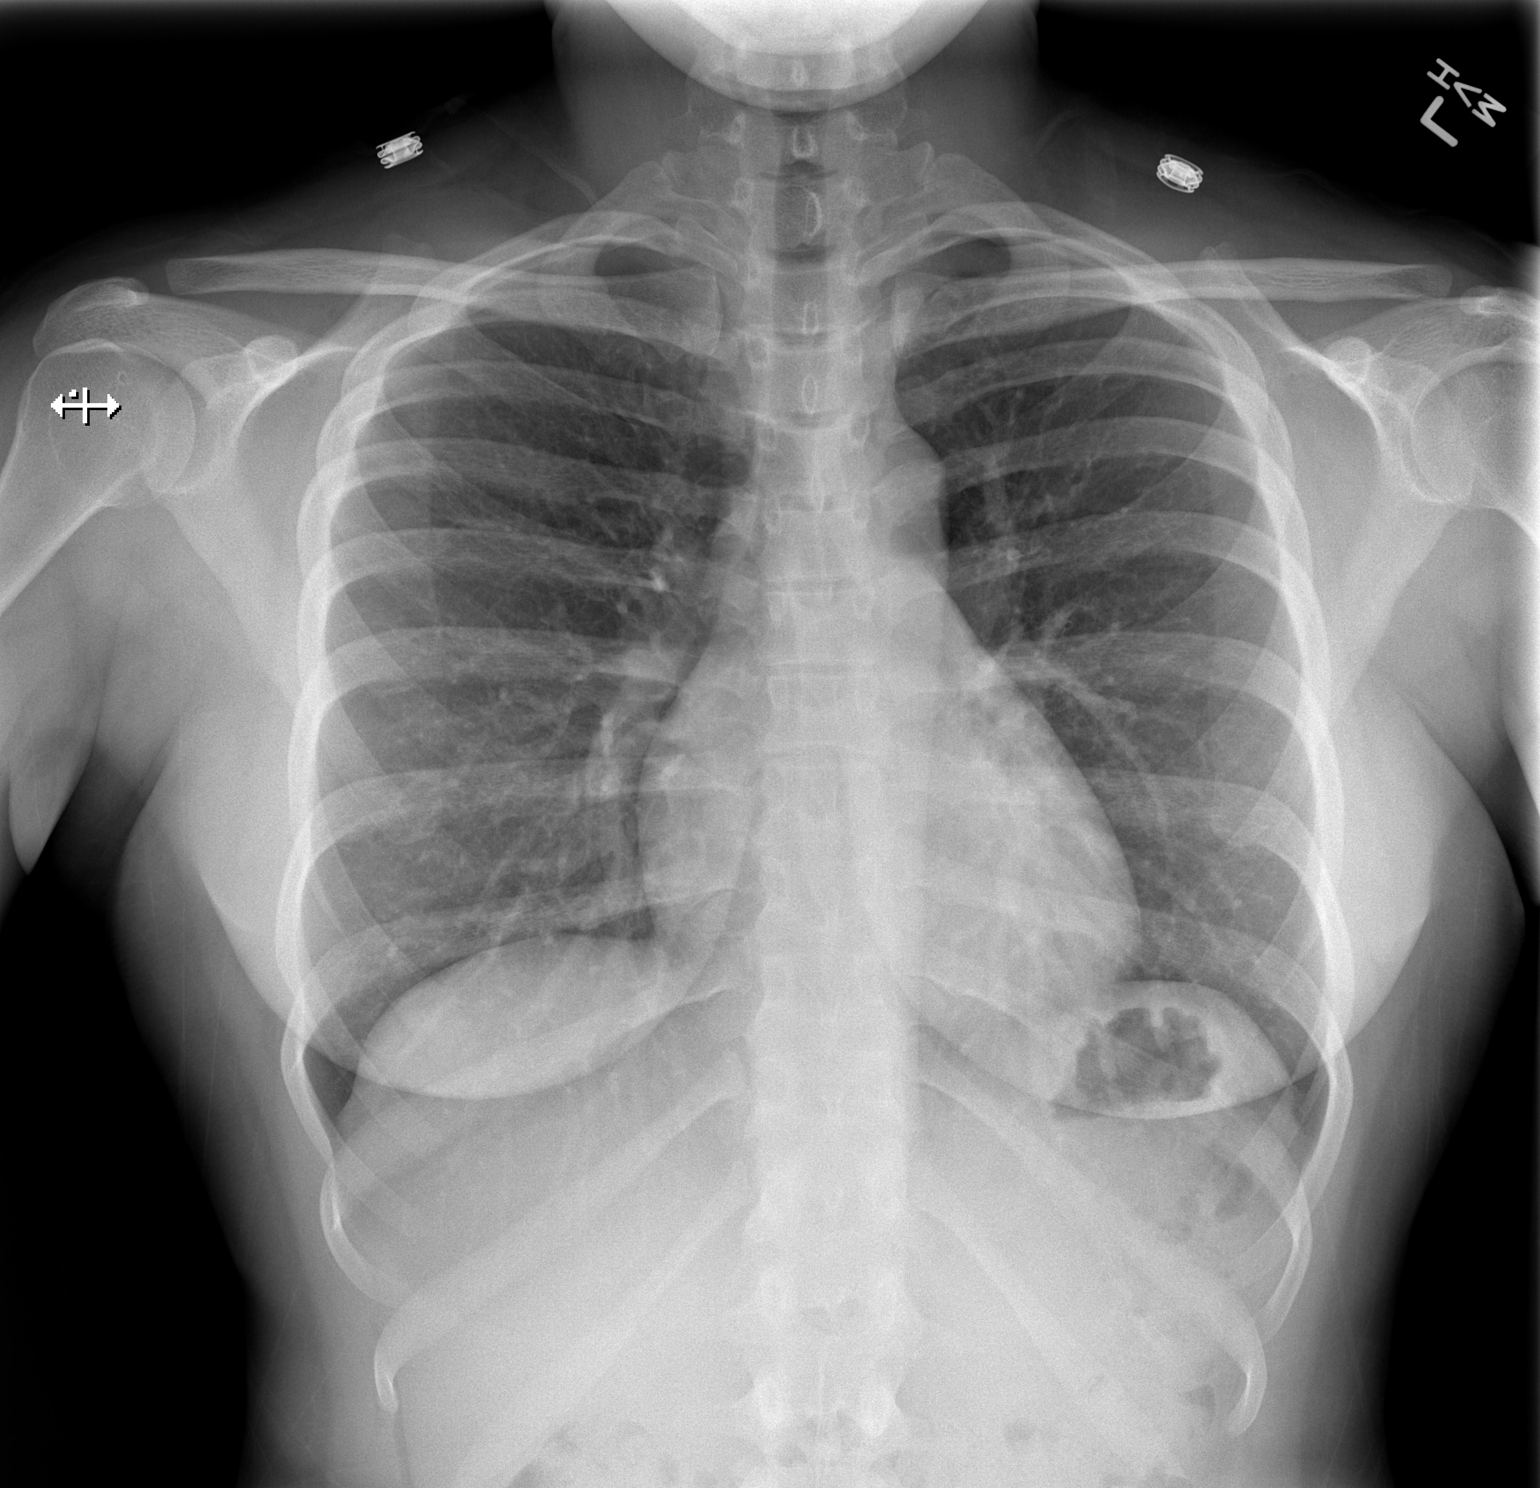

[w chest lat]
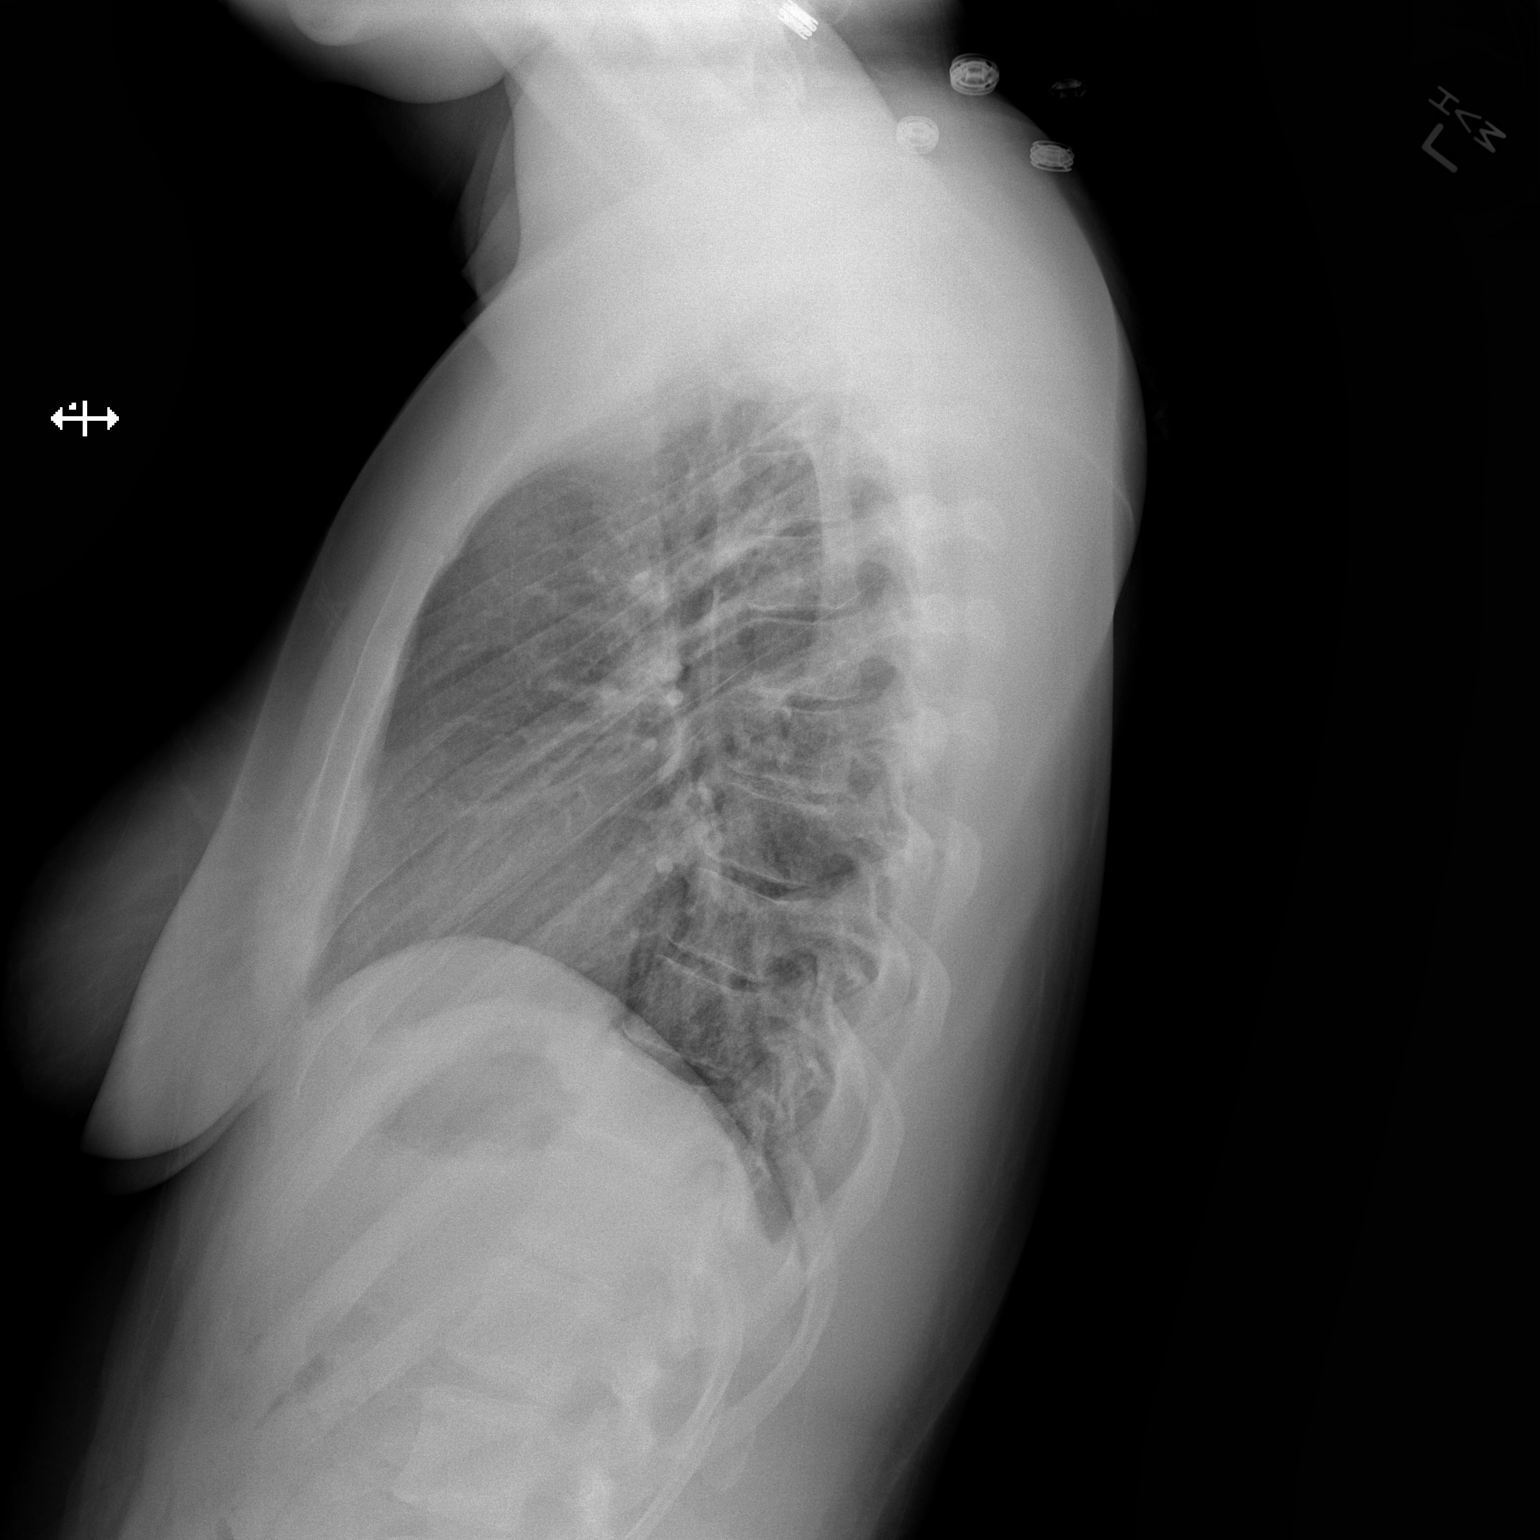

[2 of 2 positions shown; findings below may reference images not displayed]

FINDINGS: The heart size and mediastinal contours are within normal limits.
Both lungs are clear. The visualized skeletal structures are
unremarkable.
IMPRESSION: No active cardiopulmonary disease.

## 2015-07-07 ENCOUNTER — Encounter (HOSPITAL_COMMUNITY): Payer: Self-pay | Admitting: *Deleted

## 2015-07-07 ENCOUNTER — Inpatient Hospital Stay (HOSPITAL_COMMUNITY)
Admission: AD | Admit: 2015-07-07 | Discharge: 2015-07-07 | Disposition: A | Discharge status: Home / Self Care | Payer: No Typology Code available for payment source | Source: Ambulatory Visit | Attending: Family Medicine | Admitting: Family Medicine

## 2015-07-07 DIAGNOSIS — B9689 Other specified bacterial agents as the cause of diseases classified elsewhere: Secondary | ICD-10-CM

## 2015-07-07 DIAGNOSIS — A499 Bacterial infection, unspecified: Secondary | ICD-10-CM

## 2015-07-07 DIAGNOSIS — R1030 Lower abdominal pain, unspecified: Secondary | ICD-10-CM | POA: Insufficient documentation

## 2015-07-07 DIAGNOSIS — J45909 Unspecified asthma, uncomplicated: Secondary | ICD-10-CM | POA: Insufficient documentation

## 2015-07-07 DIAGNOSIS — F17210 Nicotine dependence, cigarettes, uncomplicated: Secondary | ICD-10-CM | POA: Insufficient documentation

## 2015-07-07 DIAGNOSIS — N760 Acute vaginitis: Secondary | ICD-10-CM | POA: Insufficient documentation

## 2015-07-07 DIAGNOSIS — R110 Nausea: Secondary | ICD-10-CM | POA: Insufficient documentation

## 2015-07-07 DIAGNOSIS — Z113 Encounter for screening for infections with a predominantly sexual mode of transmission: Secondary | ICD-10-CM | POA: Insufficient documentation

## 2015-07-07 LAB — URINALYSIS, ROUTINE W REFLEX MICROSCOPIC
Bilirubin Urine: NEGATIVE
Glucose, UA: NEGATIVE mg/dL
HGB URINE DIPSTICK: NEGATIVE
Ketones, ur: NEGATIVE mg/dL
Leukocytes, UA: NEGATIVE
Nitrite: NEGATIVE
Protein, ur: NEGATIVE mg/dL
SPECIFIC GRAVITY, URINE: 1.015 (ref 1.005–1.030)
pH: 6.5 (ref 5.0–8.0)

## 2015-07-07 LAB — POCT PREGNANCY, URINE: Preg Test, Ur: NEGATIVE

## 2015-07-07 LAB — WET PREP, GENITAL
SPERM: NONE SEEN
Trich, Wet Prep: NONE SEEN

## 2015-07-07 MED ORDER — METRONIDAZOLE 500 MG PO TABS: 500.0000 mg | ORAL_TABLET | Freq: Two times a day (BID) | ORAL | Status: AC

## 2015-07-07 NOTE — MAU Provider Note (Signed)
History     CSN: [id number] Arrival date and time: 07/07/15 2129 First Provider Initiated Contact with Patient 07/07/15 2219      Chief Complaint  Patient presents with  . Abdominal Pain  . Vaginal Discharge  . Nausea   HPI Christina David is a 25 y.o. here with abdominal pain, vaginal discharge and nausea. Reports sx started with nausea 2 weeks ago and reports worsening lower abdominal pain for 2 days. Describes as throbbing.Denies bleeding. Reports white discharge. Reports 1 new partner, had unprotected sex about 2 weeks ago. She is not using anything for pregnancy prevention. Reports some diarrhea today.    OB History    Gravida Para Term Preterm AB TAB SAB Ectopic Multiple Living   0               Past Medical History  Diagnosis Date  . Asthma   . Mental disorder     Never had Md diagnosis    Past Surgical History  Procedure Laterality Date  . Umbilical hernia repair    . Tonsilectomy, adenoidectomy, bilateral myringotomy and tubes      Family History  Problem Relation Age of Onset  . Hypertension Mother     Social History  Substance Use Topics  . Smoking status: Current Every Day Smoker -- 0.25 packs/day    Types: Cigarettes  . Smokeless tobacco: Never Used  . Alcohol Use: No    Allergies:  Allergies  Allergen Reactions  . Bee Venom Swelling  . Trileptal [Oxcarbazepine] Hives, Swelling and Other (See Comments)    Headaches     Prescriptions prior to admission  Medication Sig Dispense Refill Last Dose  . albuterol (PROVENTIL HFA;VENTOLIN HFA) 108 (90 BASE) MCG/ACT inhaler Inhale 2 puffs into the lungs every 6 (six) hours as needed for wheezing or shortness of breath.    10/02/2013   . HYDROcodone-acetaminophen (NORCO/VICODIN) 5-325 MG per tablet Take 1-2 tablets by mouth every 6 (six) hours as needed for moderate pain or severe pain. 10 tablet 0   . ibuprofen (ADVIL,MOTRIN) 600 MG tablet Take 1 tablet (600 mg total) by mouth every 6 (six) hours as  needed. 30 tablet 0   . metroNIDAZOLE (FLAGYL) 500 MG tablet Take 1 tablet (500 mg total) by mouth 2 (two) times daily. 14 tablet 0   . metroNIDAZOLE (METROGEL VAGINAL) 0.75 % vaginal gel Place 1 Applicatorful vaginally 2 (two) times daily. 70 g 0     Review of Systems  Constitutional: Negative for fever and chills.  Eyes: Negative for blurred vision and double vision.  Respiratory: Negative for cough and shortness of breath.   Cardiovascular: Negative for chest pain and orthopnea.  Gastrointestinal: Positive for nausea and abdominal pain. Negative for vomiting.  Genitourinary: Negative for dysuria, frequency and flank pain.       Vaginal discharge   Musculoskeletal: Negative for myalgias.  Skin: Negative for rash.  Neurological: Negative for dizziness, tingling, weakness and headaches.  Endo/Heme/Allergies: Does not bruise/bleed easily.  Psychiatric/Behavioral: Negative for depression and suicidal ideas. The patient is not nervous/anxious.    Physical Exam   Blood pressure 135/94, pulse 87, temperature 97.3 F (36.3 C), resp. rate 18, height  (1.676 m), weight 168 lb 12.8 oz (76.567 kg), last menstrual period 06/19/2015.  Physical Exam  Nursing note and vitals reviewed. Constitutional: She is oriented to person, place, and time. She appears well-developed and well-nourished. No distress.  HENT:  Head: Normocephalic and atraumatic.  Eyes: Conjunctivae are normal.  No scleral icterus.  Neck: Normal range of motion. Neck supple.  Cardiovascular: Normal rate and intact distal pulses.   Respiratory: Effort normal. She exhibits no tenderness.  GI: Soft. There is no tenderness. There is no rebound and no guarding.  Genitourinary: Vaginal discharge found.  Thin discharge, + fishy odor. No CMT on BME  Musculoskeletal: Normal range of motion. She exhibits no edema.  Neurological: She is alert and oriented to person, place, and time.  Skin: Skin is warm and dry. No rash noted.   Psychiatric: She has a normal mood and affect.    MAU Course  Procedures  MDM- ddx includes BV, GC.CT, possible PID, unlikely appendicitis given non-peritoneal sx. Unlikely cholecystitis given location of pain. Unlikely ovarian torsion but could be possible.   Results for orders placed or performed during the hospital encounter of 07/07/15 (from the past 48 hour(s))  Wet prep, genital     Status: Abnormal   Collection Time: 07/07/15 10:40 PM  Result Value Ref Range   Yeast Wet Prep HPF POC PRESENT (A) NONE SEEN   Trich, Wet Prep NONE SEEN NONE SEEN   Clue Cells Wet Prep HPF POC PRESENT (A) NONE SEEN   WBC, Wet Prep HPF POC FEW (A) NONE SEEN    Comment: FEW BACTERIA SEEN   Sperm NONE SEEN   Pregnancy, urine POC     Status: None   Collection Time: 07/07/15 10:58 PM  Result Value Ref Range   Preg Test, Ur NEGATIVE NEGATIVE    Comment:        THE SENSITIVITY OF THIS METHODOLOGY IS >24 mIU/mL      Assessment and Plan   #Bacterial Vaginosis - Metronidazole 500 BID x 7 days  #STD screening - follow up GC/CT, no need for empirical treatment given lack of CMT on exam.  Christina David 07/07/2015, 11:06 PM   Attending physician; Candelaria CelesteJacob Stinson

## 2015-07-07 NOTE — MAU Note (Signed)
Pt had gone out to car to talk with someone and then returned to Rm 6. Dr Alvester MorinNewton in to discuss d/c plan

## 2015-07-07 NOTE — Discharge Instructions (Signed)
Bacterial Vaginosis  Bacterial vaginosis is a vaginal infection that occurs when the normal balance of bacteria in the vagina is disrupted. It results from an overgrowth of certain bacteria. This is the most common vaginal infection in women of childbearing age. Treatment is important to prevent complications, especially in pregnant women, as it can cause a premature delivery.  CAUSES   Bacterial vaginosis is caused by an increase in harmful bacteria that are normally present in smaller amounts in the vagina. Several different kinds of bacteria can cause bacterial vaginosis. However, the reason that the condition develops is not fully understood.  RISK FACTORS  Certain activities or behaviors can put you at an increased risk of developing bacterial vaginosis, including:  · Having a new sex partner or multiple sex partners.  · Douching.  · Using an intrauterine device (IUD) for contraception.  Women do not get bacterial vaginosis from toilet seats, bedding, swimming pools, or contact with objects around them.  SIGNS AND SYMPTOMS   Some women with bacterial vaginosis have no signs or symptoms. Common symptoms include:  · Grey vaginal discharge.  · A fishlike odor with discharge, especially after sexual intercourse.  · Itching or burning of the vagina and vulva.  · Burning or pain with urination.  DIAGNOSIS   Your health care provider will take a medical history and examine the vagina for signs of bacterial vaginosis. A sample of vaginal fluid may be taken. Your health care provider will look at this sample under a microscope to check for bacteria and abnormal cells. A vaginal pH test may also be done.   TREATMENT   Bacterial vaginosis may be treated with antibiotic medicines. These may be given in the form of a pill or a vaginal cream. A second round of antibiotics may be prescribed if the condition comes back after treatment. Because bacterial vaginosis increases your risk for sexually transmitted diseases, getting  treated can help reduce your risk for chlamydia, gonorrhea, HIV, and herpes.  HOME CARE INSTRUCTIONS   · Only take over-the-counter or prescription medicines as directed by your health care provider.  · If antibiotic medicine was prescribed, take it as directed. Make sure you finish it even if you start to feel better.  · Tell all sexual partners that you have a vaginal infection. They should see their health care provider and be treated if they have problems, such as a mild rash or itching.  · During treatment, it is important that you follow these instructions:  ¨ Avoid sexual activity or use condoms correctly.  ¨ Do not douche.  ¨ Avoid alcohol as directed by your health care provider.  ¨ Avoid breastfeeding as directed by your health care provider.  SEEK MEDICAL CARE IF:   · Your symptoms are not improving after 3 days of treatment.  · You have increased discharge or pain.  · You have a fever.  MAKE SURE YOU:   · Understand these instructions.  · Will watch your condition.  · Will get help right away if you are not doing well or get worse.  FOR MORE INFORMATION   Centers for Disease Control and Prevention, Division of STD Prevention: www.cdc.gov/std  American Sexual Health Association (ASHA): www.ashastd.org      This information is not intended to replace advice given to you by your health care provider. Make sure you discuss any questions you have with your health care provider.     Document Released: 03/04/2005 Document Revised: 03/25/2014 Document Reviewed: 10/14/2012    Elsevier Interactive Patient Education ©2016 [employer]  Bacterial Vaginosis  Bacterial vaginosis is a vaginal infection that occurs when the normal balance of bacteria in the vagina is disrupted. It results from an overgrowth of certain bacteria. This is the most common vaginal infection in women of childbearing age. Treatment is important to prevent complications, especially in pregnant women, as it can cause a premature delivery.  CAUSES    Bacterial vaginosis is caused by an increase in harmful bacteria that are normally present in smaller amounts in the vagina. Several different kinds of bacteria can cause bacterial vaginosis. However, the reason that the condition develops is not fully understood.  RISK FACTORS  Certain activities or behaviors can put you at an increased risk of developing bacterial vaginosis, including:  · Having a new sex partner or multiple sex partners.  · Douching.  · Using an intrauterine device (IUD) for contraception.  Women do not get bacterial vaginosis from toilet seats, bedding, swimming pools, or contact with objects around them.  SIGNS AND SYMPTOMS   Some women with bacterial vaginosis have no signs or symptoms. Common symptoms include:  · Grey vaginal discharge.  · A fishlike odor with discharge, especially after sexual intercourse.  · Itching or burning of the vagina and vulva.  · Burning or pain with urination.  DIAGNOSIS   Your health care provider will take a medical history and examine the vagina for signs of bacterial vaginosis. A sample of vaginal fluid may be taken. Your health care provider will look at this sample under a microscope to check for bacteria and abnormal cells. A vaginal pH test may also be done.   TREATMENT   Bacterial vaginosis may be treated with antibiotic medicines. These may be given in the form of a pill or a vaginal cream. A second round of antibiotics may be prescribed if the condition comes back after treatment. Because bacterial vaginosis increases your risk for sexually transmitted diseases, getting treated can help reduce your risk for chlamydia, gonorrhea, HIV, and herpes.  HOME CARE INSTRUCTIONS   · Only take over-the-counter or prescription medicines as directed by your health care provider.  · If antibiotic medicine was prescribed, take it as directed. Make sure you finish it even if you start to feel better.  · Tell all sexual partners that you have a vaginal infection. They  should see their health care provider and be treated if they have problems, such as a mild rash or itching.  · During treatment, it is important that you follow these instructions:  ¨ Avoid sexual activity or use condoms correctly.  ¨ Do not douche.  ¨ Avoid alcohol as directed by your health care provider.  ¨ Avoid breastfeeding as directed by your health care provider.  SEEK MEDICAL CARE IF:   · Your symptoms are not improving after 3 days of treatment.  · You have increased discharge or pain.  · You have a fever.  MAKE SURE YOU:   · Understand these instructions.  · Will watch your condition.  · Will get help right away if you are not doing well or get worse.  FOR MORE INFORMATION   Centers for Disease Control and Prevention, Division of STD Prevention: www.cdc.gov/std  American Sexual Health Association (ASHA): www.ashastd.org      This information is not intended to replace advice given to you by your health care provider. Make sure you discuss any questions you have with your health care provider.     Document   Released: 03/04/2005 Document Revised: 03/25/2014 Document Reviewed: 10/14/2012  Elsevier Interactive Patient Education ©2016 Elsevier Inc.

## 2015-07-07 NOTE — MAU Note (Signed)
Having nausea for 2 wks and bad lower abd pain for 2 days. Throbbing. Denies bleeding. Some white d/c with odor.

## 2015-07-07 NOTE — MAU Note (Signed)
RN went in to do d/c vitals and pt not in room. Waited a few mins and pt never returned. PT not in lobby.

## 2015-07-10 LAB — GC/CHLAMYDIA PROBE AMP (~~LOC~~) NOT AT ARMC
CHLAMYDIA, DNA PROBE: NEGATIVE
Neisseria Gonorrhea: NEGATIVE

## 2016-01-17 ENCOUNTER — Emergency Department (HOSPITAL_COMMUNITY): Payer: No Typology Code available for payment source

## 2016-01-17 ENCOUNTER — Emergency Department (HOSPITAL_COMMUNITY)
Admission: EM | Admit: 2016-01-17 | Discharge: 2016-01-17 | Disposition: A | Discharge status: Home / Self Care | Payer: No Typology Code available for payment source | Attending: Emergency Medicine | Admitting: Emergency Medicine

## 2016-01-17 ENCOUNTER — Encounter (HOSPITAL_COMMUNITY): Payer: Self-pay | Admitting: Emergency Medicine

## 2016-01-17 DIAGNOSIS — S199XXA Unspecified injury of neck, initial encounter: Secondary | ICD-10-CM | POA: Diagnosis present

## 2016-01-17 DIAGNOSIS — Y92410 Unspecified street and highway as the place of occurrence of the external cause: Secondary | ICD-10-CM | POA: Insufficient documentation

## 2016-01-17 DIAGNOSIS — Y939 Activity, unspecified: Secondary | ICD-10-CM | POA: Diagnosis not present

## 2016-01-17 DIAGNOSIS — F17210 Nicotine dependence, cigarettes, uncomplicated: Secondary | ICD-10-CM | POA: Diagnosis not present

## 2016-01-17 DIAGNOSIS — M549 Dorsalgia, unspecified: Secondary | ICD-10-CM | POA: Diagnosis not present

## 2016-01-17 DIAGNOSIS — Z79899 Other long term (current) drug therapy: Secondary | ICD-10-CM | POA: Insufficient documentation

## 2016-01-17 DIAGNOSIS — M25522 Pain in left elbow: Secondary | ICD-10-CM | POA: Insufficient documentation

## 2016-01-17 DIAGNOSIS — Y999 Unspecified external cause status: Secondary | ICD-10-CM | POA: Insufficient documentation

## 2016-01-17 DIAGNOSIS — J45909 Unspecified asthma, uncomplicated: Secondary | ICD-10-CM | POA: Insufficient documentation

## 2016-01-17 DIAGNOSIS — M542 Cervicalgia: Secondary | ICD-10-CM | POA: Diagnosis not present

## 2016-01-17 MED ORDER — IBUPROFEN 800 MG PO TABS
800.0000 mg | ORAL_TABLET | Freq: Once | ORAL | Status: AC
  Administered 2016-01-17: 800 mg via ORAL
  Filled 2016-01-17: qty 1

## 2016-01-17 MED ORDER — METHOCARBAMOL 500 MG PO TABS: 500.0000 mg | ORAL_TABLET | Freq: Two times a day (BID) | ORAL | 0 refills | Status: DC

## 2016-01-17 MED ORDER — METHOCARBAMOL 500 MG PO TABS
750.0000 mg | ORAL_TABLET | Freq: Once | ORAL | Status: AC
  Administered 2016-01-17: 750 mg via ORAL
  Filled 2016-01-17: qty 2

## 2016-01-17 MED ORDER — NAPROXEN 500 MG PO TABS: 500.0000 mg | ORAL_TABLET | Freq: Two times a day (BID) | ORAL | 0 refills | Status: DC
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
## 2016-01-17 NOTE — Progress Notes (Signed)
Pt not in room when ED CM visited to discuss pcp for f/u care
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
## 2016-01-17 NOTE — Discharge Instructions (Signed)
Take your medications as prescribed as needed for pain relief. I also recommend applying ice to affected area for 15 minutes 3-4 times daily for additional pain relief. Please follow up with a primary care provider from the Resource Guide provided below in one week if your symptoms have not improved. Please return to the Emergency Department if symptoms worsen or new onset of fever, headache, neck stiffness, lightheadedness, dizziness, confusion, memory loss, chest pain, difficult breathing, abdominal pain, vomiting, loss of control of bowel or bladder, numbness, tingling, weakness.

## 2016-04-21 ENCOUNTER — Emergency Department (HOSPITAL_COMMUNITY)
Admission: EM | Admit: 2016-04-21 | Discharge: 2016-04-21 | Disposition: A | Discharge status: Home / Self Care | Payer: No Typology Code available for payment source | Attending: Emergency Medicine | Admitting: Emergency Medicine

## 2016-04-21 ENCOUNTER — Emergency Department (HOSPITAL_COMMUNITY): Payer: No Typology Code available for payment source

## 2016-04-21 ENCOUNTER — Encounter (HOSPITAL_COMMUNITY): Payer: Self-pay

## 2016-04-21 DIAGNOSIS — T07XXXA Unspecified multiple injuries, initial encounter: Secondary | ICD-10-CM

## 2016-04-21 DIAGNOSIS — M79652 Pain in left thigh: Secondary | ICD-10-CM | POA: Diagnosis not present

## 2016-04-21 DIAGNOSIS — Y999 Unspecified external cause status: Secondary | ICD-10-CM | POA: Diagnosis not present

## 2016-04-21 DIAGNOSIS — S29009A Unspecified injury of muscle and tendon of unspecified wall of thorax, initial encounter: Secondary | ICD-10-CM | POA: Diagnosis not present

## 2016-04-21 DIAGNOSIS — Z79899 Other long term (current) drug therapy: Secondary | ICD-10-CM | POA: Diagnosis not present

## 2016-04-21 DIAGNOSIS — Y939 Activity, unspecified: Secondary | ICD-10-CM | POA: Insufficient documentation

## 2016-04-21 DIAGNOSIS — J45909 Unspecified asthma, uncomplicated: Secondary | ICD-10-CM | POA: Insufficient documentation

## 2016-04-21 DIAGNOSIS — M791 Myalgia: Secondary | ICD-10-CM | POA: Insufficient documentation

## 2016-04-21 DIAGNOSIS — F17210 Nicotine dependence, cigarettes, uncomplicated: Secondary | ICD-10-CM | POA: Diagnosis not present

## 2016-04-21 DIAGNOSIS — Y92410 Unspecified street and highway as the place of occurrence of the external cause: Secondary | ICD-10-CM | POA: Insufficient documentation

## 2016-04-21 DIAGNOSIS — S299XXA Unspecified injury of thorax, initial encounter: Secondary | ICD-10-CM | POA: Diagnosis present

## 2016-04-21 DIAGNOSIS — R0789 Other chest pain: Secondary | ICD-10-CM

## 2016-04-21 MED ORDER — CYCLOBENZAPRINE HCL 10 MG PO TABS: 10.0000 mg | ORAL_TABLET | Freq: Two times a day (BID) | ORAL | 0 refills | Status: DC | PRN

## 2016-04-21 MED ORDER — IBUPROFEN 400 MG PO TABS
600.0000 mg | ORAL_TABLET | Freq: Once | ORAL | Status: AC
  Administered 2016-04-21: 600 mg via ORAL
  Filled 2016-04-21: qty 1

## 2016-04-21 MED ORDER — CYCLOBENZAPRINE HCL 10 MG PO TABS
10.0000 mg | ORAL_TABLET | Freq: Once | ORAL | Status: AC
  Administered 2016-04-21: 10 mg via ORAL
  Filled 2016-04-21: qty 1

## 2016-04-21 MED ORDER — DICLOFENAC SODIUM 50 MG PO TBEC: 50.0000 mg | DELAYED_RELEASE_TABLET | Freq: Two times a day (BID) | ORAL | 0 refills | Status: DC

## 2016-04-21 NOTE — ED Notes (Signed)
Patient currently in xray ?

## 2016-04-21 NOTE — Discharge Instructions (Signed)
Do not drive while taking the muscle relaxant as it will make you sleepy. °

## 2016-04-21 NOTE — ED Notes (Signed)
Declined W/C at D/C and was escorted to lobby by RN. 
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
## 2016-04-21 NOTE — ED Notes (Signed)
Patient transported to X-ray 
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
## 2016-09-18 ENCOUNTER — Emergency Department (HOSPITAL_COMMUNITY): Payer: No Typology Code available for payment source

## 2016-09-18 ENCOUNTER — Emergency Department (HOSPITAL_COMMUNITY)
Admission: EM | Admit: 2016-09-18 | Discharge: 2016-09-18 | Disposition: A | Discharge status: Home / Self Care | Payer: No Typology Code available for payment source | Attending: Emergency Medicine | Admitting: Emergency Medicine

## 2016-09-18 ENCOUNTER — Encounter (HOSPITAL_COMMUNITY): Payer: Self-pay | Admitting: Emergency Medicine

## 2016-09-18 DIAGNOSIS — F17210 Nicotine dependence, cigarettes, uncomplicated: Secondary | ICD-10-CM | POA: Insufficient documentation

## 2016-09-18 DIAGNOSIS — Y999 Unspecified external cause status: Secondary | ICD-10-CM | POA: Insufficient documentation

## 2016-09-18 DIAGNOSIS — M25512 Pain in left shoulder: Secondary | ICD-10-CM

## 2016-09-18 DIAGNOSIS — M62838 Other muscle spasm: Secondary | ICD-10-CM | POA: Diagnosis not present

## 2016-09-18 DIAGNOSIS — Y92410 Unspecified street and highway as the place of occurrence of the external cause: Secondary | ICD-10-CM | POA: Diagnosis not present

## 2016-09-18 DIAGNOSIS — Y939 Activity, unspecified: Secondary | ICD-10-CM | POA: Diagnosis not present

## 2016-09-18 DIAGNOSIS — Z79899 Other long term (current) drug therapy: Secondary | ICD-10-CM | POA: Diagnosis not present

## 2016-09-18 DIAGNOSIS — S161XXA Strain of muscle, fascia and tendon at neck level, initial encounter: Secondary | ICD-10-CM

## 2016-09-18 DIAGNOSIS — J45909 Unspecified asthma, uncomplicated: Secondary | ICD-10-CM | POA: Diagnosis not present

## 2016-09-18 DIAGNOSIS — S199XXA Unspecified injury of neck, initial encounter: Secondary | ICD-10-CM | POA: Diagnosis present

## 2016-09-18 MED ORDER — CYCLOBENZAPRINE HCL 10 MG PO TABS: 10.0000 mg | ORAL_TABLET | Freq: Three times a day (TID) | ORAL | 0 refills | Status: DC | PRN

## 2016-09-18 MED ORDER — ACETAMINOPHEN 325 MG PO TABS
650.0000 mg | ORAL_TABLET | Freq: Once | ORAL | Status: AC
  Administered 2016-09-18: 650 mg via ORAL
  Filled 2016-09-18: qty 2

## 2016-09-18 MED ORDER — NAPROXEN 500 MG PO TABS: 500.0000 mg | ORAL_TABLET | Freq: Two times a day (BID) | ORAL | 0 refills | Status: DC | PRN
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
## 2016-09-18 NOTE — Discharge Instructions (Signed)
Take naprosyn as directed for inflammation and pain with tylenol for breakthrough pain and flexeril for muscle relaxation. Do not drive or operate machinery with muscle relaxant use. Ice to areas of soreness for the next 24 hours and then may move to heat, no more than 20 minutes at a time every hour for each. Expect to be sore for the next few days and follow up with primary care physician for recheck of ongoing symptoms in the next 1-2 weeks. Return to ER for emergent changing or worsening of symptoms.  °  °

## 2016-09-20 ENCOUNTER — Encounter (HOSPITAL_COMMUNITY): Payer: Self-pay | Admitting: Nurse Practitioner

## 2016-09-20 DIAGNOSIS — Y92410 Unspecified street and highway as the place of occurrence of the external cause: Secondary | ICD-10-CM | POA: Diagnosis not present

## 2016-09-20 DIAGNOSIS — Z79899 Other long term (current) drug therapy: Secondary | ICD-10-CM | POA: Diagnosis not present

## 2016-09-20 DIAGNOSIS — R51 Headache: Secondary | ICD-10-CM | POA: Diagnosis not present

## 2016-09-20 DIAGNOSIS — Y999 Unspecified external cause status: Secondary | ICD-10-CM | POA: Insufficient documentation

## 2016-09-20 DIAGNOSIS — S161XXA Strain of muscle, fascia and tendon at neck level, initial encounter: Secondary | ICD-10-CM | POA: Diagnosis not present

## 2016-09-20 DIAGNOSIS — S199XXA Unspecified injury of neck, initial encounter: Secondary | ICD-10-CM | POA: Diagnosis present

## 2016-09-20 DIAGNOSIS — Y9389 Activity, other specified: Secondary | ICD-10-CM | POA: Insufficient documentation

## 2016-09-20 DIAGNOSIS — J45909 Unspecified asthma, uncomplicated: Secondary | ICD-10-CM | POA: Insufficient documentation

## 2016-09-20 DIAGNOSIS — F17210 Nicotine dependence, cigarettes, uncomplicated: Secondary | ICD-10-CM | POA: Diagnosis not present
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
## 2016-09-21 ENCOUNTER — Emergency Department (HOSPITAL_COMMUNITY)
Admission: EM | Admit: 2016-09-21 | Discharge: 2016-09-21 | Disposition: A | Discharge status: Home / Self Care | Payer: No Typology Code available for payment source | Attending: Emergency Medicine | Admitting: Emergency Medicine

## 2016-09-21 DIAGNOSIS — R519 Headache, unspecified: Secondary | ICD-10-CM

## 2016-09-21 DIAGNOSIS — S161XXA Strain of muscle, fascia and tendon at neck level, initial encounter: Secondary | ICD-10-CM

## 2016-09-21 DIAGNOSIS — R51 Headache: Secondary | ICD-10-CM

## 2016-09-21 LAB — POC URINE PREG, ED: PREG TEST UR: NEGATIVE

## 2016-09-21 MED ORDER — METHOCARBAMOL 500 MG PO TABS: 500.0000 mg | ORAL_TABLET | Freq: Two times a day (BID) | ORAL | 0 refills | Status: DC

## 2016-09-21 MED ORDER — NAPROXEN 500 MG PO TABS
500.0000 mg | ORAL_TABLET | Freq: Once | ORAL | Status: AC
  Administered 2016-09-21: 500 mg via ORAL
  Filled 2016-09-21: qty 1

## 2016-09-21 MED ORDER — METHOCARBAMOL 500 MG PO TABS
500.0000 mg | ORAL_TABLET | Freq: Once | ORAL | Status: AC
  Administered 2016-09-21: 500 mg via ORAL
  Filled 2016-09-21: qty 1

## 2016-09-21 MED ORDER — IBUPROFEN 600 MG PO TABS: 600.0000 mg | ORAL_TABLET | Freq: Four times a day (QID) | ORAL | 0 refills | Status: DC | PRN
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
## 2017-08-03 ENCOUNTER — Emergency Department (HOSPITAL_COMMUNITY): Admission: EM | Admit: 2017-08-03 | Discharge: 2017-08-03 | Discharge status: Against Medical Advice | Payer: Self-pay

## 2017-08-03 NOTE — ED Notes (Signed)
No answer when called for triage 

## 2017-08-03 NOTE — ED Notes (Signed)
No answer when called in the lobby.

## 2017-08-03 NOTE — ED Notes (Signed)
Pt called for triage with no answer 

## 2017-08-03 NOTE — ED Notes (Signed)
No answer when call from lobby for third time

## 2017-08-03 NOTE — ED Notes (Signed)
No answer when called in the lobby and women's bathroom.

## 2017-08-03 NOTE — ED Notes (Signed)
Called for triage, no answer

## 2017-08-20 ENCOUNTER — Emergency Department (HOSPITAL_COMMUNITY)
Admission: EM | Admit: 2017-08-20 | Discharge: 2017-08-21 | Disposition: A | Discharge status: Home / Self Care | Payer: Self-pay | Attending: Physician Assistant | Admitting: Physician Assistant

## 2017-08-20 ENCOUNTER — Encounter (HOSPITAL_COMMUNITY): Payer: Self-pay | Admitting: Emergency Medicine

## 2017-08-20 ENCOUNTER — Emergency Department (HOSPITAL_COMMUNITY): Payer: Self-pay

## 2017-08-20 DIAGNOSIS — F319 Bipolar disorder, unspecified: Secondary | ICD-10-CM | POA: Insufficient documentation

## 2017-08-20 DIAGNOSIS — F17210 Nicotine dependence, cigarettes, uncomplicated: Secondary | ICD-10-CM | POA: Insufficient documentation

## 2017-08-20 DIAGNOSIS — F1414 Cocaine abuse with cocaine-induced mood disorder: Secondary | ICD-10-CM | POA: Diagnosis present

## 2017-08-20 DIAGNOSIS — F312 Bipolar disorder, current episode manic severe with psychotic features: Secondary | ICD-10-CM | POA: Diagnosis present

## 2017-08-20 DIAGNOSIS — R200 Anesthesia of skin: Secondary | ICD-10-CM | POA: Insufficient documentation

## 2017-08-20 DIAGNOSIS — Z79899 Other long term (current) drug therapy: Secondary | ICD-10-CM | POA: Insufficient documentation

## 2017-08-20 DIAGNOSIS — J45909 Unspecified asthma, uncomplicated: Secondary | ICD-10-CM | POA: Insufficient documentation

## 2017-08-20 LAB — COMPREHENSIVE METABOLIC PANEL
ALBUMIN: 3.8 g/dL (ref 3.5–5.0)
ALT: 16 U/L (ref 14–54)
AST: 25 U/L (ref 15–41)
Alkaline Phosphatase: 69 U/L (ref 38–126)
Anion gap: 6 (ref 5–15)
BUN: 7 mg/dL (ref 6–20)
CHLORIDE: 105 mmol/L (ref 101–111)
CO2: 27 mmol/L (ref 22–32)
CREATININE: 0.79 mg/dL (ref 0.44–1.00)
Calcium: 9.4 mg/dL (ref 8.9–10.3)
GFR calc Af Amer: >60 mL/min (ref >60)
GFR calc non Af Amer: >60 mL/min (ref >60)
GLUCOSE: 91 mg/dL (ref 65–99)
POTASSIUM: 4.4 mmol/L (ref 3.5–5.1)
SODIUM: 138 mmol/L (ref 135–145)
Total Bilirubin: 0.5 mg/dL (ref 0.3–1.2)
Total Protein: 8.3 g/dL — ABNORMAL HIGH (ref 6.5–8.1)

## 2017-08-20 LAB — I-STAT BETA HCG BLOOD, ED (MC, WL, AP ONLY): I-stat hCG, quantitative: <5 m[IU]/mL (ref <5)

## 2017-08-20 LAB — CBC
HCT: 35.7 % — ABNORMAL LOW (ref 36.0–46.0)
HEMOGLOBIN: 12 g/dL (ref 12.0–15.0)
MCH: 32.6 pg (ref 26.0–34.0)
MCHC: 33.6 g/dL (ref 30.0–36.0)
MCV: 97 fL (ref 78.0–100.0)
PLATELETS: 310 10*3/uL (ref 150–400)
RBC: 3.68 MIL/uL — AB (ref 3.87–5.11)
RDW: 13.2 % (ref 11.5–15.5)
WBC: 7.1 10*3/uL (ref 4.0–10.5)

## 2017-08-20 LAB — ETHANOL: ALCOHOL ETHYL (B): 12 mg/dL — AB (ref <10)

## 2017-08-20 LAB — ACETAMINOPHEN LEVEL: Acetaminophen (Tylenol), Serum: <10 ug/mL — ABNORMAL LOW (ref 10–30)

## 2017-08-20 LAB — SALICYLATE LEVEL: Salicylate Lvl: <7 mg/dL (ref 2.8–30.0)

## 2017-08-20 MED ORDER — STERILE WATER FOR INJECTION IJ SOLN
INTRAMUSCULAR | Status: AC
  Administered 2017-08-20: 10 mL
  Filled 2017-08-20: qty 10

## 2017-08-20 MED ORDER — DIPHENHYDRAMINE HCL 50 MG/ML IJ SOLN
INTRAMUSCULAR | Status: AC
  Administered 2017-08-20: 50 mg via INTRAMUSCULAR
  Filled 2017-08-20: qty 1

## 2017-08-20 MED ORDER — ZIPRASIDONE MESYLATE 20 MG IM SOLR
INTRAMUSCULAR | Status: AC
  Administered 2017-08-20: 20 mg via INTRAMUSCULAR
  Filled 2017-08-20: qty 20

## 2017-08-20 MED ORDER — DIPHENHYDRAMINE HCL 50 MG/ML IJ SOLN
50.0000 mg | Freq: Once | INTRAMUSCULAR | Status: AC
  Administered 2017-08-20: 50 mg via INTRAMUSCULAR

## 2017-08-20 MED ORDER — ZIPRASIDONE MESYLATE 20 MG IM SOLR
20.0000 mg | Freq: Once | INTRAMUSCULAR | Status: AC
  Administered 2017-08-20: 20 mg via INTRAMUSCULAR

## 2017-08-20 MED ORDER — OLANZAPINE 10 MG PO TABS
10.0000 mg | ORAL_TABLET | Freq: Two times a day (BID) | ORAL | Status: DC
  Administered 2017-08-21: 10 mg via ORAL
  Filled 2017-08-20 (×3): qty 1

## 2017-08-20 NOTE — ED Notes (Signed)
Mom number [phone] Ladonna Snide(shonda)

## 2017-08-20 NOTE — ED Notes (Signed)
IVC received from Magistrate, [employer] notified to serve patient.

## 2017-08-20 NOTE — ED Notes (Signed)
This nurse assuming care of pt at this time. Pt noted in bed eyes closed, appears to be sleeping. No s/s of distress noted, breathing non labored, respirations equal. Special checks q 15 mins in place for safety, Video monitoring in place. Will continue to monitor.

## 2017-08-20 NOTE — ED Notes (Addendum)
Calmer, speech still rambling, reports that she had court this past Monday, and that the police have been watching her to get more charges against her.  Pt reports that she had been parting the week after court.  Pt denies si/hi/avh at this time.  No bobby pin located, pt reports that she removed it prior to ct.  Pt reports that she can to the ed for rt leg pain that she has had for several days and to get help finding someone to talk things over with.

## 2017-08-20 NOTE — ED Triage Notes (Signed)
Patient brought in by [employer] voluntary with complaints of suicidal and homicidal ideation. No plan. States "Im crazy, I probably overdosed on cocaine, I'm hungry and need a sandwich".

## 2017-08-20 NOTE — ED Notes (Signed)
On arrival to unit pt was noted to have money in her pocket.  Pt initially declined to give money to staff.  Per security/[employer] pt agreed to  Let security give the money to her mother.

## 2017-08-20 NOTE — ED Notes (Signed)
Patient is resting comfortably. 

## 2017-08-20 NOTE — BH Assessment (Addendum)
Assessment Note  Christina David is an 27 y.o. female. Pt denies SI/HI. Pt appears to be delusional. Per Pt she feels [employer] and the [employer] are using surveillance to build a case against her. Per Pt she went to a police station today to ask the police to help her since they are constantly watching her. The Pt was a poor historian. The Pt's speech was tangential. The Pt had flight of ideas. The Pt denies previous mental health history. Per Epic notes the Pt received inpatient treatment in 2005 at [facility]. 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 Pt states she was incarcerated from age [age] for conspiracy.  Collateral information: Obtained from the Pt's mother Pershing Cox. Per Ms. Leigh the Pt was diagnosed with Bipolar in the past but she has not had any mental health treatment. Ms. Marliss Czar states that the Pt's symptoms are worsening. Ms. Marliss Czar states that the Pt refuses mental health treatment.   Diagnosis:  F31.9 Bipolar, with psychotic features   Past Medical History:  Past Medical History:  Diagnosis Date  . Asthma   . Mental disorder    Never had Md diagnosis    Past Surgical History:  Procedure Laterality Date  . TONSILECTOMY, ADENOIDECTOMY, BILATERAL MYRINGOTOMY AND TUBES    . UMBILICAL HERNIA REPAIR      Family History:  Family History  Problem Relation Age of Onset  . Hypertension Mother     Social History:  reports that she has been smoking cigarettes.  She has been smoking about 0.25 packs per day. She has never used smokeless tobacco. She reports that she has current or past drug history. Drug: Marijuana. She reports that she does not drink alcohol.  Additional Social History:  Alcohol / Drug Use Pain Medications: please see mar Prescriptions: please see mar Over the Counter: please see mar History of alcohol / drug use?: Yes Longest period of sobriety (when/how long): unknown Substance #1 Name of Substance 1: cocaine 1 - Age of First Use: unknown 1 - Amount (size/oz): Pt states  "enough" 1 - Frequency: daily 1 - Duration: ongoing 1 - Last Use / Amount: 08/19/17  CIWA: CIWA-Ar BP: (!) 143/106 Pulse Rate: 84 COWS:    Allergies:  Allergies  Allergen Reactions  . Bee Venom Swelling  . Trileptal [Oxcarbazepine] Hives, Swelling and Other (See Comments)    Headaches     Home Medications:  (Not in a hospital admission)  OB/GYN Status:  Patient's last menstrual period was 08/05/2017.  General Assessment Data Location of Assessment: [facility] ED TTS Assessment: In system Is this a Tele or Face-to-Face Assessment?: Face-to-Face Is this an Initial Assessment or a Re-assessment for this encounter?: Initial Assessment Marital status: Single Maiden name: NA Is patient pregnant?: No Pregnancy Status: No Living Arrangements: Parent Can pt return to current living arrangement?: Yes Admission Status: Voluntary Is patient capable of signing voluntary admission?: Yes Referral Source: Self/Family/Friend Insurance type: SP     Crisis Care Plan Living Arrangements: Parent Legal Guardian: Other:(self) Name of Psychiatrist: NA Name of Therapist: NA  Education Status Is patient currently in school?: No Is the patient employed, unemployed or receiving disability?: Unemployed  Risk to self with the past 6 months Suicidal Ideation: No Has patient been a risk to self within the past 6 months prior to admission? : No Suicidal Intent: No Has patient had any suicidal intent within the past 6 months prior to admission? : No Is patient at risk for suicide?: No Suicidal Plan?: No Has patient had  any suicidal plan within the past 6 months prior to admission? : No Access to Means: No What has been your use of drugs/alcohol within the last 12 months?: cocaine Previous Attempts/Gestures: No How many times?: 0 Other Self Harm Risks: NA Triggers for Past Attempts: None known Intentional Self Injurious Behavior: None Family Suicide History: No Recent stressful life event(s):  Trauma (Comment) Persecutory voices/beliefs?: Yes Depression: No Depression Symptoms: (pt denies) Substance abuse history and/or treatment for substance abuse?: Yes Suicide prevention information given to non-admitted patients: Not applicable  Risk to Others within the past 6 months Homicidal Ideation: No Does patient have any lifetime risk of violence toward others beyond the six months prior to admission? : No Thoughts of Harm to Others: No Current Homicidal Intent: No Current Homicidal Plan: No Access to Homicidal Means: No Identified Victim: NA History of harm to others?: No Assessment of Violence: None Noted Violent Behavior Description: NA Does patient have access to weapons?: No Criminal Charges Pending?: Yes Describe Pending Criminal Charges: Pt states she has drug charges pending Does patient have a court date: No Is patient on probation?: No  Psychosis Hallucinations: None noted Delusions: Persecutory  Mental Status Report Appearance/Hygiene: Disheveled Eye Contact: Fair Motor Activity: Freedom of movement Speech: Tangential Level of Consciousness: Alert Mood: Suspicious Affect: Anxious Anxiety Level: Moderate Thought Processes: Tangential, Flight of Ideas Judgement: Impaired Orientation: Person, Place Obsessive Compulsive Thoughts/Behaviors: None  Cognitive Functioning Concentration: Decreased Memory: Recent Impaired, Remote Impaired Is patient IDD: No Is patient DD?: No Insight: Poor Impulse Control: Poor Appetite: Fair Have you had any weight changes? : No Change Sleep: Decreased Total Hours of Sleep: 5 Vegetative Symptoms: None  ADLScreening [facility] Patient's cognitive ability adequate to safely complete daily activities?: No Patient able to express need for assistance with ADLs?: Yes Independently performs ADLs?: Yes (appropriate for developmental age)  Prior Inpatient Therapy Prior Inpatient Therapy: No  Prior Outpatient  Therapy Prior Outpatient Therapy: No Does patient have an ACCT team?: No Does patient have Intensive In-House Services?  : No Does patient have [facility] services? : No Does patient have P4CC services?: No  ADL Screening (condition at time of admission) Patient's cognitive ability adequate to safely complete daily activities?: No Is the patient deaf or have difficulty hearing?: No Does the patient have difficulty seeing, even when wearing glasses/contacts?: No Does the patient have difficulty concentrating, remembering, or making decisions?: Yes Patient able to express need for assistance with ADLs?: Yes Does the patient have difficulty dressing or bathing?: No Independently performs ADLs?: Yes (appropriate for developmental age)       Abuse/Neglect Assessment (Assessment to be complete while patient is alone) Abuse/Neglect Assessment Can Be Completed: Yes Physical Abuse: Denies Verbal Abuse: Denies Sexual Abuse: Denies Exploitation of patient/patient's resources: Denies     Merchant navy officerAdvance Directives (For Healthcare) Does Patient Have a Medical Advance Directive?: No Would patient like information on creating a medical advance directive?: No - Patient declined    Additional Information 1:1 In Past 12 Months?: No CIRT Risk: Yes Elopement Risk: Yes Does patient have medical clearance?: Yes     Disposition:  Disposition Initial Assessment Completed for this Encounter: Yes Disposition of Patient: Admit Type of inpatient treatment program: Adult  On Site Evaluation by:   Reviewed with Physician:    Wolfgang PhoenixLevette,Mallissa Lorenzen D 08/20/2017 1:02 PM

## 2017-08-20 NOTE — ED Notes (Signed)
Bed: [id number] Expected date:  Expected time:  Means of arrival:  Comments: Room 27 

## 2017-08-20 NOTE — ED Notes (Signed)
Patient sleep. Nurse notified. 

## 2017-08-20 NOTE — BH Assessment (Signed)
Patient meets criteria for inpatient treatment, per [facility],DNP. [facility] outside facilities: [facility], [facility], [place], [facility], [facility], [facility], and [facility].

## 2017-08-20 NOTE — ED Notes (Signed)
Bed: [facility] Expected date:  Expected time:  Means of arrival:  Comments: 

## 2017-08-20 NOTE — ED Notes (Signed)
Pt ambulatory w/o difficulty from TCU 

## 2017-08-20 NOTE — ED Notes (Signed)
Bed: [facility]27 Expected date:  Expected time:  Means of arrival:  Comments: 

## 2017-08-20 NOTE — ED Provider Notes (Signed)
[facility]-EMERGENCY DEPT Provider Note   CSN: [id number] Arrival date & time: 08/20/17  4098     History   Chief Complaint Chief Complaint  Patient presents with  . Medical Clearance  . Suicidal  . Homicidal    HPI Christina David is a 27 y.o. female.  HPI   27 year old female presenting with multiple things.  Patient states "I am hungry need something to eat".  She also states "my nose hurts from all the cocaine".  Patient says she is been feeling sad, and then talks for 1 to 2 minutes about how police are both chasing her and helping her. She reports "they are keeping tabs on me" Patient also said that her right foot has felt numb on top of it.  She is really difficult historian it is impossible to get a good story about how or when this happened or started.  She reports reports occasional clumsiness with that foot.  She thinks it is "been going on for a while now maybe days may be more".    Past Medical History:  Diagnosis Date  . Asthma   . Mental disorder    Never had Md diagnosis    There are no active problems to display for this patient.   Past Surgical History:  Procedure Laterality Date  . TONSILECTOMY, ADENOIDECTOMY, BILATERAL MYRINGOTOMY AND TUBES    . UMBILICAL HERNIA REPAIR       OB History    Gravida  0   Para      Term      Preterm      AB      Living        SAB      TAB      Ectopic      Multiple      Live Births               Home Medications    Prior to Admission medications   Medication Sig Start Date End Date Taking? Authorizing Provider  albuterol (PROVENTIL HFA;VENTOLIN HFA) 108 (90 BASE) MCG/ACT inhaler Inhale 2 puffs into the lungs every 6 (six) hours as needed for wheezing or shortness of breath.     [provider]  cyclobenzaprine (FLEXERIL) 10 MG tablet Take 1 tablet (10 mg total) by mouth 2 (two) times daily as needed for muscle spasms. Patient not taking: Reported on 09/21/2016  04/21/16   Janne Napoleon, NP  cyclobenzaprine (FLEXERIL) 10 MG tablet Take 1 tablet (10 mg total) by mouth 3 (three) times daily as needed for muscle spasms. 09/18/16   Street, [place], PA-C  diclofenac (VOLTAREN) 50 MG EC tablet Take 1 tablet (50 mg total) by mouth 2 (two) times daily. Patient not taking: Reported on 09/21/2016 04/21/16   Janne Napoleon, NP  HYDROcodone-acetaminophen (NORCO/VICODIN) 5-325 MG per tablet Take 1-2 tablets by mouth every 6 (six) hours as needed for moderate pain or severe pain. Patient not taking: Reported on 09/21/2016 10/04/13   Roxy Horseman, PA-C  ibuprofen (ADVIL,MOTRIN) 600 MG tablet Take 1 tablet (600 mg total) by mouth every 6 (six) hours as needed. 09/21/16   Derwood Kaplan, MD  methocarbamol (ROBAXIN) 500 MG tablet Take 1 tablet (500 mg total) by mouth 2 (two) times daily. 09/21/16   Derwood Kaplan, MD  naproxen (NAPROSYN) 500 MG tablet Take 1 tablet (500 mg total) by mouth 2 (two) times daily. Patient not taking: Reported on 09/21/2016 01/17/16   Barrett Henle,  PA-C  naproxen (NAPROSYN) 500 MG tablet Take 1 tablet (500 mg total) by mouth 2 (two) times daily as needed for mild pain, moderate pain or headache (TAKE WITH MEALS.). Patient not taking: Reported on 09/21/2016 09/18/16   Street, [place]one Tree[place], PA-C  naproxen sodium (ANAPROX) 220 MG tablet Take 440 mg by mouth 2 (two) times daily as needed. Pain    [provider]    Family History Family History  Problem Relation Age of Onset  . Hypertension Mother     Social History Social History   Tobacco Use  . Smoking status: Current Every Day Smoker    Packs/day: 0.25    Types: Cigarettes  . Smokeless tobacco: Never Used  Substance Use Topics  . Alcohol use: No  . Drug use: Yes    Types: Marijuana    Comment: states does not smoke it anymore     Allergies   Bee venom and Trileptal [oxcarbazepine]   Review of Systems Review of Systems  Unable to perform ROS: Psychiatric disorder      Physical Exam Updated Vital Signs BP (!) 143/106 (BP Location: Right Arm)   Pulse 84   Temp 97.9 F (36.6 C) (Oral)   Resp 17   SpO2 100%   Physical Exam  Constitutional: She is oriented to person, place, and time. She appears well-developed and well-nourished.  HENT:  Head: Normocephalic and atraumatic.  Normal turbinates.  Eyes: Conjunctivae are normal. Right eye exhibits no discharge.  Neck: Neck supple.  Cardiovascular: Normal rate, regular rhythm and normal heart sounds.  No murmur heard. Pulmonary/Chest: Effort normal and breath sounds normal. She has no wheezes. She has no rales.  Abdominal: Soft. She exhibits no distension. There is no tenderness.  Musculoskeletal: Normal range of motion. She exhibits no edema.  Patient reacts to pain in all dermatomes of bilateral lower extremities.  She is equal strength in bilateral ankle flexors, knee, hip.  Patient ambulates without dragging feet, normal with a normal gait.  Pulses intact.  Neurological: She is oriented to person, place, and time. No cranial nerve deficit.  Skin: Skin is warm and dry. No rash noted. She is not diaphoretic.  Nursing note and vitals reviewed.    ED Treatments / Results  Labs (all labs ordered are listed, but only abnormal results are displayed) Labs Reviewed  COMPREHENSIVE METABOLIC PANEL  ETHANOL  SALICYLATE LEVEL  ACETAMINOPHEN LEVEL  CBC  RAPID URINE DRUG SCREEN, HOSP PERFORMED  URINALYSIS, ROUTINE W REFLEX MICROSCOPIC  I-STAT BETA HCG BLOOD, ED ([facility], [facility], [facility] ONLY)  POC URINE PREG, ED    EKG None  Radiology No results found.  Procedures Procedures (including critical care time)  Medications Ordered in ED Medications - No data to display   Initial Impression / Assessment and Plan / ED Course  I have reviewed the triage vital signs and the nursing notes.  Pertinent labs & imaging results that were available during my care of the patient were reviewed by me and considered in  my medical decision making (see chart for details).     27 year old female presenting with multiple things.  Patient states "I am hungry need something to eat".  She also states "my nose hurts from all the cocaine".  Patient says she is been feeling sad, and then talks for 1 to 2 minutes about how police are both chasing her and helping her.  Patient also said that her right foot has felt numb on top of it.  She is really  difficult historian it is impossible to get a good story about how or when this happened or started.  She reports reports occasional clumsiness with that foot.  She thinks it is "been going on for a while now maybe days may be more".   11:07 AM Patient very difficult historian.  Patient has bipolar and is unclear whether this is drug mediated or due to the psychiatric disorder.  I have difficult time deciding how much to make of this foot occasional numbness/not working.  Parts of her history like her using "two feet to drive" are concerning however she is also very erratic in her stories and it is changed over the course of me being in the room.  I think is reasonable to do a screening work-up on her including CAT scan of the head.  This is all normal given her normal physical exam at this point, would defer further imaging such as MRI of spine and head for outpatient work-up.     11:55 AM  Patient trying to leave without permission.  I found her in the hallway.  Discussed that she should remain calm come back and get the full reevaluation.  Given that she said that she was suicidal/homicidal to the police earlier today, I do think she needs a full [facility] eval before I feel comfortable letting her leave.  She denies SI HI now.  But does seem irritated.  Expressing delusional thoughts.   ,12:41 PM Nursing states [facility] will keeping her, will IVC her. IVC completed.  Discusse with patietn and mother at bedside.    Final Clinical Impressions(s) / ED Diagnoses   Final diagnoses:   None    ED Discharge Orders    None       Abelino Derrick, MD 08/21/17 [phone]

## 2017-08-20 NOTE — ED Notes (Addendum)
Pt up in hall rambling, crying, cursing yelling at staff/security.  Pt unable to be redirected security/[employer] assisting and were able to direct pt back to her room.

## 2017-08-21 DIAGNOSIS — R451 Restlessness and agitation: Secondary | ICD-10-CM

## 2017-08-21 DIAGNOSIS — Z653 Problems related to other legal circumstances: Secondary | ICD-10-CM

## 2017-08-21 DIAGNOSIS — F1414 Cocaine abuse with cocaine-induced mood disorder: Secondary | ICD-10-CM

## 2017-08-21 DIAGNOSIS — F17210 Nicotine dependence, cigarettes, uncomplicated: Secondary | ICD-10-CM

## 2017-08-21 DIAGNOSIS — F319 Bipolar disorder, unspecified: Secondary | ICD-10-CM

## 2017-08-21 LAB — URINALYSIS, ROUTINE W REFLEX MICROSCOPIC
Bilirubin Urine: NEGATIVE
GLUCOSE, UA: NEGATIVE mg/dL
HGB URINE DIPSTICK: NEGATIVE
Ketones, ur: NEGATIVE mg/dL
Leukocytes, UA: NEGATIVE
Nitrite: NEGATIVE
PH: 5 (ref 5.0–8.0)
Protein, ur: NEGATIVE mg/dL
SPECIFIC GRAVITY, URINE: 1.023 (ref 1.005–1.030)

## 2017-08-21 LAB — RAPID URINE DRUG SCREEN, HOSP PERFORMED
Amphetamines: NOT DETECTED
BENZODIAZEPINES: NOT DETECTED
Barbiturates: NOT DETECTED
COCAINE: POSITIVE — AB
Opiates: NOT DETECTED
Tetrahydrocannabinol: POSITIVE — AB

## 2017-08-21 MED ORDER — RISPERIDONE 0.5 MG PO TABS: 0.5000 mg | ORAL_TABLET | Freq: Once | ORAL | 0 refills | Status: DC

## 2017-08-21 MED ORDER — RISPERIDONE 0.5 MG PO TABS
0.5000 mg | ORAL_TABLET | Freq: Once | ORAL | Status: AC
  Administered 2017-08-21: 0.5 mg via ORAL
  Filled 2017-08-21: qty 1

## 2017-08-21 NOTE — Patient Outreach (Signed)
ED Peer Support Specialist Patient Intake (Complete at intake & 30-60 Day Follow-up)  Name: Christina David  MRN: [id number]  Age: 27 y.o.   Date of Admission: 08/21/2017  Intake: Initial Comments:      Primary Reason Admitted: Cocaine abuse   Lab values: Alcohol/ETOH: Positive Positive UDS? Yes Amphetamines: No Barbiturates: No Benzodiazepines: No Cocaine: Yes Opiates: No Cannabinoids: Yes  Demographic information: Gender: Female Ethnicity: African American Marital Status: Single Insurance Status: Uninsured/Self-pay(Family planing ) Ecologist (Work Neurosurgeon, Physicist, medical, etc.: No Lives with: Alone Living situation:    Reported Patient History: Patient reported health conditions: Anxiety disorders, Bipolar disorder, Asthma Patient aware of HIV and hepatitis status:    In past year, has patient visited ED for any reason? Yes  Number of ED visits: 3  Reason(s) for visit: various situation  In past year, has patient been hospitalized for any reason? No  Number of hospitalizations:    Reason(s) for hospitalization:    In past year, has patient been arrested? Yes  Number of arrests:    Reason(s) for arrest: Carrying a gun   In past year, has patient been incarcerated? No  Number of incarcerations:    Reason(s) for incarceration:    In past year, has patient received medication-assisted treatment? No  In past year, patient received the following treatments:    In past year, has patient received any harm reduction services? No  Did this include any of the following?    In past year, has patient received care from a mental health provider for diagnosis other than SUD? No  In past year, is this first time patient has overdosed? No  Number of past overdoses:    In past year, is this first time patient has been hospitalized for an overdose? No  Number of hospitalizations for overdose(s):    Is patient currently receiving  treatment for a mental health diagnosis? No  Patient reports experiencing difficulty participating in SUD treatment: No    Most important reason(s) for this difficulty?    Has patient received prior services for treatment? No  In past, patient has received services from following agencies:    Plan of Care:  Suggested follow up at these agencies/treatment centers: [facility]  Other information: CPSS met with Pt an was able to gain information on what Pt wanted to do to better the quality of her life. CPSS discussed programs to assist her with her Substance abuse. CPSS addressed the fact that CPSS will be able to support Pt outside the hospital and in the community. CPSS discussed the fact that Pt will need to follow up with facilities to see if she can get herself into the out patient facility. CPSS John gave Pt information on NA groups around [place] and contact information for CPSS to stay in touch with CPSS.    Aaron Edelman Tniyah Nakagawa, CPSS  08/21/2017 11:43 AM

## 2017-08-21 NOTE — [facility] Suicide Risk Assessment (Signed)
Suicide Risk Assessment  Discharge Assessment   [facility]   Principal Problem: Cocaine abuse with cocaine-induced mood disorder [facility](HCC) Discharge Diagnoses:  Patient Active Problem List   Diagnosis Date Noted  . Cocaine abuse with cocaine-induced mood disorder [facility](HCC) [F14.14] 08/21/2017    Priority: High    Total Time spent with patient: 45 minutes  Musculoskeletal: Strength & Muscle Tone: within normal limits Gait & Station: normal Patient leans: N/A  Psychiatric Specialty Exam: Physical Exam  Nursing note and vitals reviewed. Constitutional: She is oriented to person, place, and time. She appears well-developed and well-nourished.  HENT:  Head: Normocephalic.  Neck: Normal range of motion.  Cardiovascular: Normal rate.  Respiratory: Effort normal.  Musculoskeletal: Normal range of motion.  Neurological: She is alert and oriented to person, place, and time.  Psychiatric: She has a normal mood and affect. Her speech is normal and behavior is normal. Judgment and thought content normal. Cognition and memory are normal.    Review of Systems  Psychiatric/Behavioral: Positive for substance abuse.  All other systems reviewed and are negative.   Blood pressure (!) 137/113, pulse 85, temperature 98.4 F (36.9 C), temperature source Oral, resp. rate 17, last menstrual period 08/05/2017, SpO2 95 %.There is no height or weight on file to calculate BMI.  General Appearance: Casual  Eye Contact:  Good  Speech:  Normal Rate  Volume:  Normal  Mood:  Euthymic  Affect:  Congruent  Thought Process:  Coherent and Descriptions of Associations: Intact  Orientation:  Full (Time, Place, and Person)  Thought Content:  WDL and Logical  Suicidal Thoughts:  No  Homicidal Thoughts:  No  Memory:  Immediate;   Good Recent;   Good Remote;   Good  Judgement:  Fair  Insight:  Fair  Psychomotor Activity:  Normal  Concentration:  Concentration: Good and Attention Span:  Good  Recall:  Good  Fund of Knowledge:  Fair  Language:  Good  Akathisia:  No  Handed:  Right  AIMS (if indicated):     Assets:  Leisure Time Physical Health Resilience Social Support  ADL's:  Intact  Cognition:  WNL  Sleep:      Mental Status Per Nursing Assessment::   On Admission:   cocaine abuse with agitation  Demographic Factors:  Adolescent or young adult  Loss Factors: Legal issues  Historical Factors: NA  Risk Reduction Factors:   Sense of responsibility to family, Living with another person, especially a relative and Positive social support  Continued Clinical Symptoms:  None   Cognitive Features That Contribute To Risk:  None    Suicide Risk:  Minimal: No identifiable suicidal ideation.  Patients presenting with no risk factors but with morbid ruminations; may be classified as minimal risk based on the severity of the depressive symptoms    Plan Of Care/Follow-up recommendations:  Activity:  as tolerated Diet:  heart healthy diet  Moani Weipert, NP 08/21/2017, 11:30 AM

## 2017-08-21 NOTE — ED Notes (Signed)
Pt is very drowsy,  Trying to arouse patient for discharge.

## 2017-08-21 NOTE — [facility] Counselor (Signed)
Dr. Sharma CovertNorman and Shaune PollackLord, NP, consulted with patient and recommended discharge. Patient was referred to peer and provided a prescription of Risperdal .05mg  daily. Patient recommended to follow up with outpatient provider for the continuation of services.

## 2017-08-21 NOTE — ED Notes (Signed)
Patient slept the entire shift.

## 2017-08-21 NOTE — ED Notes (Signed)
EDP notified of elevated BP,  We will direct patient to follow up with primary doctor to control BP issues.

## 2017-08-21 NOTE — Consult Note (Addendum)
[facility] Face-to-Face Psychiatry Consult   Reason for Consult:  Cocaine abuse with agitation Referring Physician:  EDP Patient Identification: Christina David MRN:  [id number] Principal Diagnosis: Cocaine abuse with cocaine-induced mood disorder [facility]) Diagnosis:   Patient Active Problem List   Diagnosis Date Noted  . Cocaine abuse with cocaine-induced mood disorder [facility]) [F14.14] 08/21/2017    Priority: High    Total Time spent with patient: 45 minutes  Subjective:   Christina David is a 27 y.o. female patient does not warrant admission  HPI: 27 yo female who presented to the ED after using cocaine and becoming agitated.  She was upset that her charges were not dismissed and she may go back to prison.  Agitation medications given yesterday and slept last night.  Today, she is calm and cooperative with no suicidal/homicidal ideations, hallucinations, or withdrawal symptoms.  Peer support consult placed, stable for discharge.  Past Psychiatric History: bipolar disorder, substance abuse  Risk to Self: Suicidal Ideation: No Suicidal Intent: No Is patient at risk for suicide?: No Suicidal Plan?: No Access to Means: No What has been your use of drugs/alcohol within the last 12 months?: cocaine How many times?: 0 Other Self Harm Risks: NA Triggers for Past Attempts: None known Intentional Self Injurious Behavior: None Risk to Others: Homicidal Ideation: No Thoughts of Harm to Others: No Current Homicidal Intent: No Current Homicidal Plan: No Access to Homicidal Means: No Identified Victim: NA History of harm to others?: No Assessment of Violence: None Noted Violent Behavior Description: NA Does patient have access to weapons?: No Criminal Charges Pending?: Yes Describe Pending Criminal Charges: Pt states she has drug charges pending Does patient have a court date: No Prior Inpatient Therapy: Prior Inpatient Therapy: Yes Prior Therapy Dates: 2005 Prior Therapy  Facilty/Provider(s): [facility] Reason for Treatment: unknown Prior Outpatient Therapy: Prior Outpatient Therapy: No Does patient have an ACCT team?: No Does patient have Intensive In-House Services?  : No Does patient have [facility] services? : No Does patient have P4CC services?: No  Past Medical History:  Past Medical History:  Diagnosis Date  . Asthma   . Mental disorder    Never had Md diagnosis    Past Surgical History:  Procedure Laterality Date  . TONSILECTOMY, ADENOIDECTOMY, BILATERAL MYRINGOTOMY AND TUBES    . UMBILICAL HERNIA REPAIR     Family History:  Family History  Problem Relation Age of Onset  . Hypertension Mother    Family Psychiatric  History: none Social History:  Social History   Substance and Sexual Activity  Alcohol Use No     Social History   Substance and Sexual Activity  Drug Use Yes  . Types: Marijuana   Comment: states does not smoke it anymore    Social History   Socioeconomic History  . Marital status: Single    Spouse name: Not on file  . Number of children: Not on file  . Years of education: Not on file  . Highest education level: Not on file  Occupational History  . Not on file  Social Needs  . Financial resource strain: Not on file  . Food insecurity:    Worry: Not on file    Inability: Not on file  . Transportation needs:    Medical: Not on file    Non-medical: Not on file  Tobacco Use  . Smoking status: Current Every Day Smoker    Packs/day: 0.25    Types: Cigarettes  . Smokeless tobacco: Never Used  Substance  and Sexual Activity  . Alcohol use: No  . Drug use: Yes    Types: Marijuana    Comment: states does not smoke it anymore  . Sexual activity: Yes    Birth control/protection: None  Lifestyle  . Physical activity:    Days per week: Not on file    Minutes per session: Not on file  . Stress: Not on file  Relationships  . Social connections:    Talks on phone: Not on file    Gets together: Not on file     Attends religious service: Not on file    Active member of club or organization: Not on file    Attends meetings of clubs or organizations: Not on file    Relationship status: Not on file  Other Topics Concern  . Not on file  Social History Narrative  . Not on file   Additional Social History: N/A    Allergies:   Allergies  Allergen Reactions  . Bee Venom Swelling  . Trileptal [Oxcarbazepine] Hives, Swelling and Other (See Comments)    Headaches     Labs:  Results for orders placed or performed during the hospital encounter of 08/20/17 (from the past 48 hour(s))  Rapid urine drug screen (hospital performed)     Status: Abnormal   Collection Time: 08/20/17 10:27 AM  Result Value Ref Range   Opiates NONE DETECTED NONE DETECTED   Cocaine POSITIVE (A) NONE DETECTED   Benzodiazepines NONE DETECTED NONE DETECTED   Amphetamines NONE DETECTED NONE DETECTED   Tetrahydrocannabinol POSITIVE (A) NONE DETECTED   Barbiturates NONE DETECTED NONE DETECTED    Comment: (NOTE) DRUG SCREEN FOR MEDICAL PURPOSES ONLY.  IF CONFIRMATION IS NEEDED FOR ANY PURPOSE, NOTIFY LAB WITHIN 5 DAYS. LOWEST DETECTABLE LIMITS FOR URINE DRUG SCREEN Drug Class                     Cutoff (ng/mL) Amphetamine and metabolites    1000 Barbiturate and metabolites    200 Benzodiazepine                 778 Tricyclics and metabolites     300 Opiates and metabolites        300 Cocaine and metabolites        300 THC                            50 Performed at [facility], [place] [address] [place], [place] [address]   Urinalysis, Routine w reflex microscopic     Status: None   Collection Time: 08/20/17 10:27 AM  Result Value Ref Range   Color, Urine YELLOW YELLOW   APPearance CLEAR CLEAR   Specific Gravity, Urine 1.023 1.005 - 1.030   pH 5.0 5.0 - 8.0   Glucose, UA NEGATIVE NEGATIVE mg/dL   Hgb urine dipstick NEGATIVE NEGATIVE   Bilirubin Urine NEGATIVE NEGATIVE   Ketones, ur NEGATIVE  NEGATIVE mg/dL   Protein, ur NEGATIVE NEGATIVE mg/dL   Nitrite NEGATIVE NEGATIVE   Leukocytes, UA NEGATIVE NEGATIVE    Comment: Performed at [facility] [address] [place], [place] [address]  Ethanol     Status: Abnormal   Collection Time: 08/20/17 11:03 AM  Result Value Ref Range   Alcohol, Ethyl (B) 12 (H) <10 mg/dL    Comment: (NOTE) Lowest detectable limit for serum alcohol is 10 mg/dL. For medical purposes only. Performed at  [facility], [place] [address], [place], [place] [address]   Salicylate level     Status: None   Collection Time: 08/20/17 11:03 AM  Result Value Ref Range   Salicylate Lvl <0.2 2.8 - 30.0 mg/dL    Comment: Performed at [facility], [place] [address], [place], [place] [address]  Acetaminophen level     Status: Abnormal   Collection Time: 08/20/17 11:03 AM  Result Value Ref Range   Acetaminophen (Tylenol), Serum <10 (L) 10 - 30 ug/mL    Comment: (NOTE) Therapeutic concentrations vary significantly. A range of 10-30 ug/mL  may be an effective concentration for many patients. However, some  are best treated at concentrations outside of this range. Acetaminophen concentrations >150 ug/mL at 4 hours after ingestion  and >50 ug/mL at 12 hours after ingestion are often associated with  toxic reactions. Performed at [facility], [place] [address] [place], [place] [address]   Comprehensive metabolic panel     Status: Abnormal   Collection Time: 08/20/17 11:07 AM  Result Value Ref Range   Sodium 138 135 - 145 mmol/L   Potassium 4.4 3.5 - 5.1 mmol/L   Chloride 105 101 - 111 mmol/L   CO2 27 22 - 32 mmol/L   Glucose, Bld 91 65 - 99 mg/dL   BUN 7 6 - 20 mg/dL   Creatinine, Ser 0.79 0.44 - 1.00 mg/dL   Calcium 9.4 8.9 - 10.3 mg/dL   Total Protein 8.3 (H) 6.5 - 8.1 g/dL   Albumin 3.8 3.5 - 5.0 g/dL   AST 25 15 - 41 U/L   ALT 16 14 - 54 U/L   Alkaline Phosphatase 69 38 - 126 U/L    Total Bilirubin 0.5 0.3 - 1.2 mg/dL   GFR calc non Af Amer >60 >60 mL/min   GFR calc Af Amer >60 >60 mL/min    Comment: (NOTE) The eGFR has been calculated using the CKD EPI equation. This calculation has not been validated in all clinical situations. eGFR's persistently <60 mL/min signify possible Chronic Kidney Disease.    Anion gap 6 5 - 15    Comment: Performed at [facility], [place] [address], [place], [place] [address]  cbc     Status: Abnormal   Collection Time: 08/20/17 11:07 AM  Result Value Ref Range   WBC 7.1 4.0 - 10.5 K/uL   RBC 3.68 (L) 3.87 - 5.11 MIL/uL   Hemoglobin 12.0 12.0 - 15.0 g/dL   HCT 35.7 (L) 36.0 - 46.0 %   MCV 97.0 78.0 - 100.0 fL   MCH 32.6 26.0 - 34.0 pg   MCHC 33.6 30.0 - 36.0 g/dL   RDW 13.2 11.5 - 15.5 %   Platelets 310 150 - 400 K/uL    Comment: Performed at [facility], [place] [address], [place], [place] [address]  I-Stat beta hCG blood, ED     Status: None   Collection Time: 08/20/17 11:17 AM  Result Value Ref Range   I-stat hCG, quantitative <5.0 <5 mIU/mL   Comment 3            Comment:   GEST. AGE      CONC.  (mIU/mL)   <=1 WEEK        5 - 50     2 WEEKS       50 - 500     3 WEEKS       100 - 10,000  4 WEEKS     1,000 - 30,000        FEMALE AND NON-PREGNANT FEMALE:     LESS THAN 5 mIU/mL     Current Facility-Administered Medications  Medication Dose Route Frequency Provider Last Rate Last Dose  . OLANZapine (ZYPREXA) tablet 10 mg  10 mg Oral BID Patrecia Pour, NP   10 mg at 08/21/17 1003   Current Outpatient Medications  Medication Sig Dispense Refill  . albuterol (PROVENTIL HFA;VENTOLIN HFA) 108 (90 BASE) MCG/ACT inhaler Inhale 2 puffs into the lungs every 6 (six) hours as needed for wheezing or shortness of breath.     . diphenhydramine-acetaminophen (TYLENOL PM) 25-500 MG TABS tablet Take 1 tablet by mouth at bedtime as needed (sleep).    . cyclobenzaprine (FLEXERIL) 10 MG tablet Take 1  tablet (10 mg total) by mouth 2 (two) times daily as needed for muscle spasms. (Patient not taking: Reported on 09/21/2016) 20 tablet 0  . cyclobenzaprine (FLEXERIL) 10 MG tablet Take 1 tablet (10 mg total) by mouth 3 (three) times daily as needed for muscle spasms. (Patient not taking: Reported on 08/20/2017) 15 tablet 0  . diclofenac (VOLTAREN) 50 MG EC tablet Take 1 tablet (50 mg total) by mouth 2 (two) times daily. (Patient not taking: Reported on 09/21/2016) 15 tablet 0  . HYDROcodone-acetaminophen (NORCO/VICODIN) 5-325 MG per tablet Take 1-2 tablets by mouth every 6 (six) hours as needed for moderate pain or severe pain. (Patient not taking: Reported on 09/21/2016) 10 tablet 0  . ibuprofen (ADVIL,MOTRIN) 600 MG tablet Take 1 tablet (600 mg total) by mouth every 6 (six) hours as needed. (Patient not taking: Reported on 08/20/2017) 30 tablet 0  . methocarbamol (ROBAXIN) 500 MG tablet Take 1 tablet (500 mg total) by mouth 2 (two) times daily. (Patient not taking: Reported on 08/20/2017) 20 tablet 0  . naproxen (NAPROSYN) 500 MG tablet Take 1 tablet (500 mg total) by mouth 2 (two) times daily. (Patient not taking: Reported on 09/21/2016) 30 tablet 0  . naproxen (NAPROSYN) 500 MG tablet Take 1 tablet (500 mg total) by mouth 2 (two) times daily as needed for mild pain, moderate pain or headache (TAKE WITH MEALS.). (Patient not taking: Reported on 09/21/2016) 20 tablet 0    Musculoskeletal: Strength & Muscle Tone: within normal limits Gait & Station: normal Patient leans: N/A  Psychiatric Specialty Exam: Physical Exam  Nursing note and vitals reviewed. Constitutional: She is oriented to person, place, and time. She appears well-developed and well-nourished.  HENT:  Head: Normocephalic and atraumatic.  Neck: Normal range of motion.  Cardiovascular: Normal rate.  Respiratory: Effort normal.  Musculoskeletal: Normal range of motion.  Neurological: She is alert and oriented to person, place, and time.   Psychiatric: She has a normal mood and affect. Her speech is normal and behavior is normal. Judgment and thought content normal. Cognition and memory are normal.    Review of Systems  Psychiatric/Behavioral: Positive for substance abuse.  All other systems reviewed and are negative.   Blood pressure (!) 137/113, pulse 85, temperature 98.4 F (36.9 C), temperature source Oral, resp. rate 17, last menstrual period 08/05/2017, SpO2 95 %.There is no height or weight on file to calculate BMI.  General Appearance: Casual  Eye Contact:  Good  Speech:  Normal Rate  Volume:  Normal  Mood:  Euthymic  Affect:  Congruent  Thought Process:  Coherent and Descriptions of Associations: Intact  Orientation:  Full (Time, Place, and Person)  Thought  Content:  WDL and Logical  Suicidal Thoughts:  No  Homicidal Thoughts:  No  Memory:  Immediate;   Good Recent;   Good Remote;   Good  Judgement:  Fair  Insight:  Fair  Psychomotor Activity:  Normal  Concentration:  Concentration: Good and Attention Span: Good  Recall:  Good  Fund of Knowledge:  Fair  Language:  Good  Akathisia:  No  Handed:  Right  AIMS (if indicated):   N/A  Assets:  Leisure Time Physical Health Resilience Social Support  ADL's:  Intact  Cognition:  WNL  Sleep:   N/A     Treatment Plan Summary: Cocaine abuse with cocaine induced mood disorder:  Bipolar affective disorder -Initially started Zyprexa 10 mg BID for mood stabilization/drug induced psychosis but changed to Risperdal 0.5 mg daily for mood stabilization  Disposition: No evidence of imminent risk to self or others at present.    Waylan Boga, NP 08/21/2017 11:22 AM   Patient seen face-to-face for psychiatric evaluation, chart reviewed and case discussed with the physician extender and developed treatment plan. Reviewed the information documented and agree with the treatment plan.  Buford Dresser, DO 08/21/17 2:27 PM

## 2017-08-21 NOTE — ED Notes (Signed)
RX and discharge instructions reviewed.  Pt understands that she needs to follow up with primary doctor asap for hypertension.   Pt is in no distress and has no belongings to return.

## 2019-01-21 IMAGING — CT CT HEAD W/O CM
3 series · 15 of 45 positions shown, 18 images · non-contrast
Comparison: None.

CLINICAL DATA: Subacute neuro deficits. Multiple complaints
including leg sensation.

EXAM:
CT HEAD WITHOUT CONTRAST
TECHNIQUE: Contiguous axial images were obtained from the base of the skull
through the vertex without intravenous contrast.

[Series 2: head wo · axial · 0.41mm/px · z∈[-110,+5]mm · 9 of 28 slices shown, 12 images]
[im 3/28  brain]
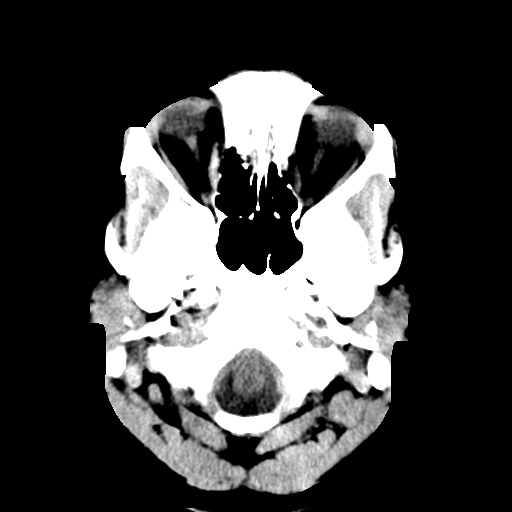
[im 3/28  bone]
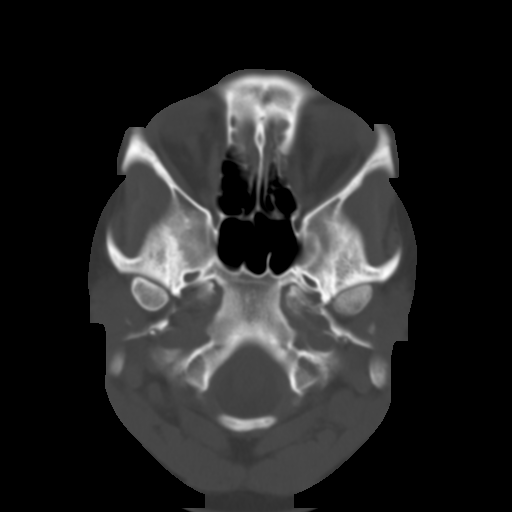
[im 6/28  brain]
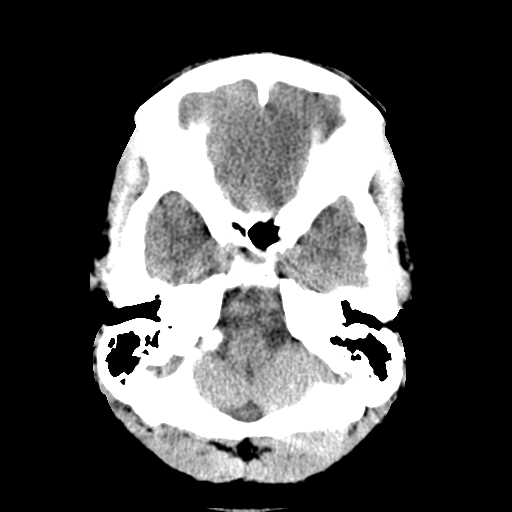
[im 9/28  brain]
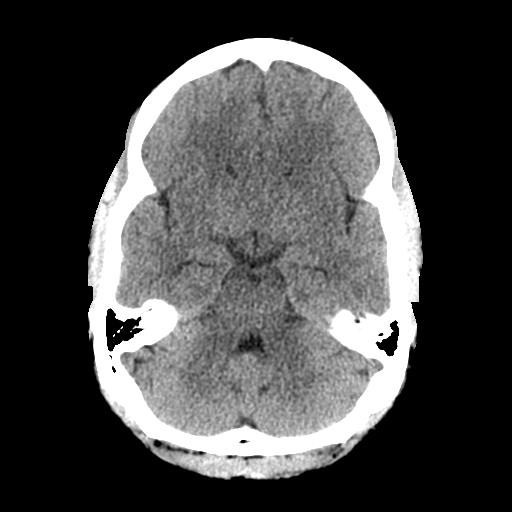
[im 12/28  brain]
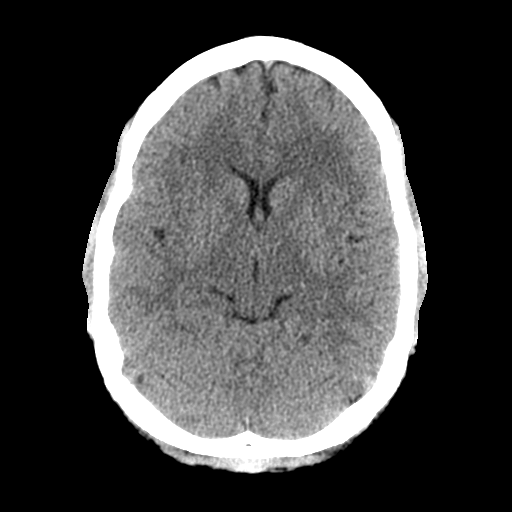
[im 15/28  brain]
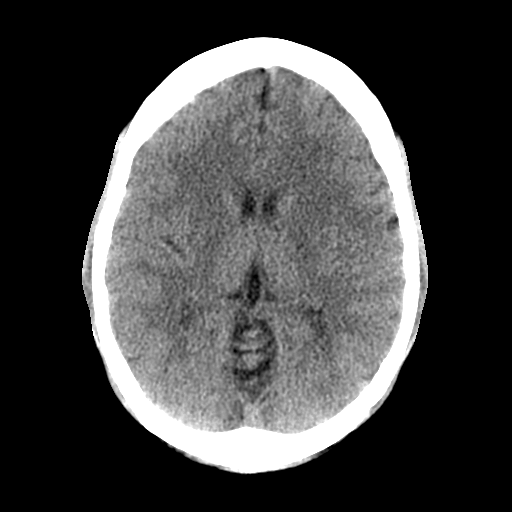
[im 15/28  bone]
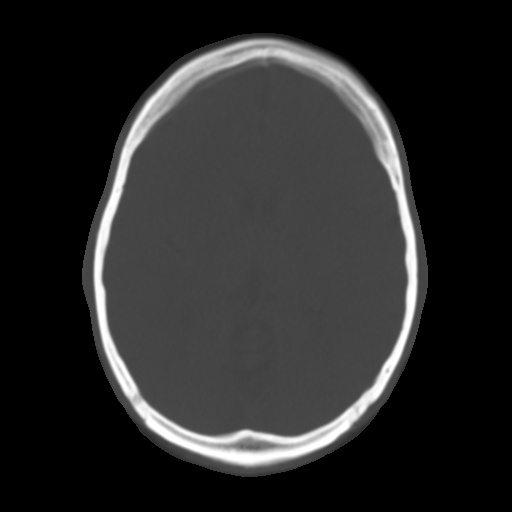
[im 17/28  brain]
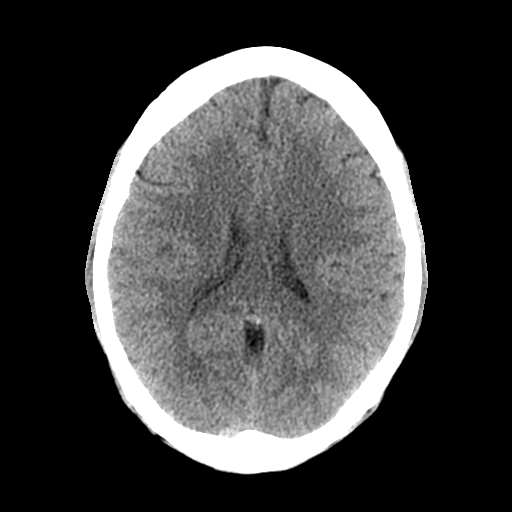
[im 20/28  brain]
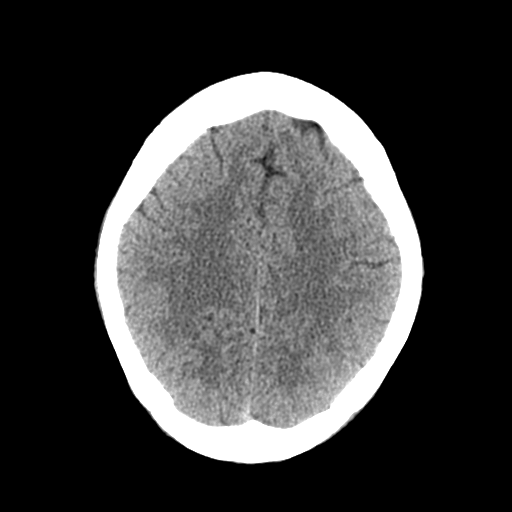
[im 23/28  brain]
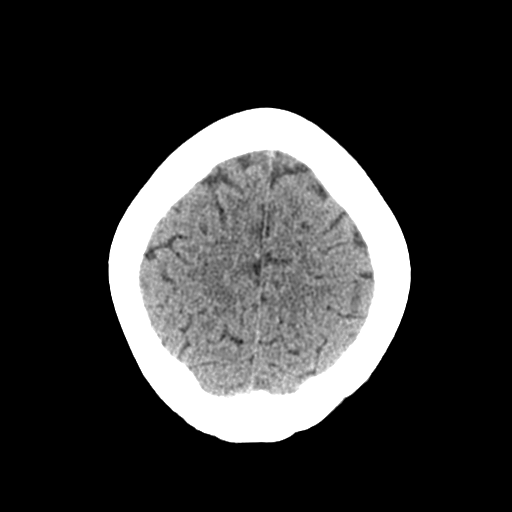
[im 26/28  brain]
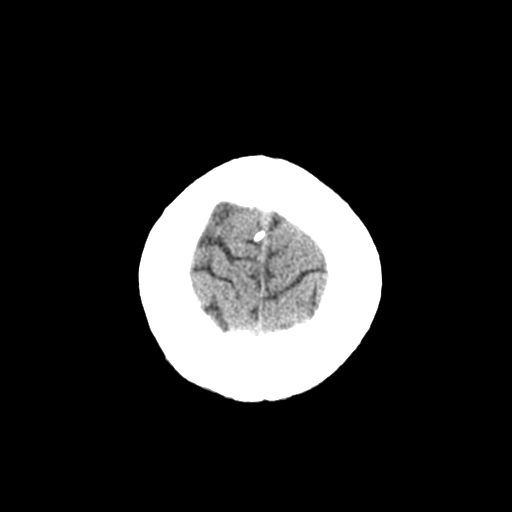
[im 26/28  bone]
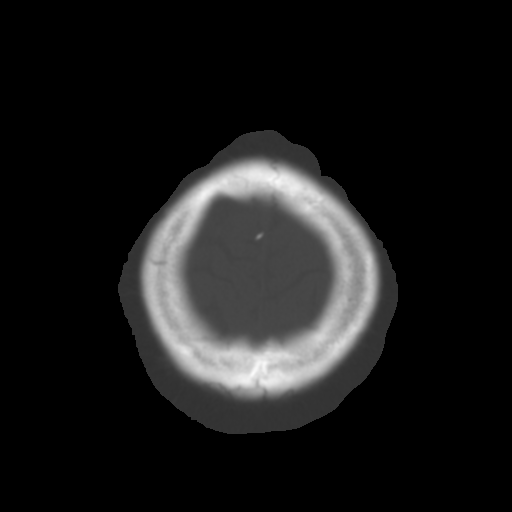

[Series 5: coronal soft tissue · coronal · 0.27mm/px · 3 of 67 slices shown]
[im 23/67  brain]
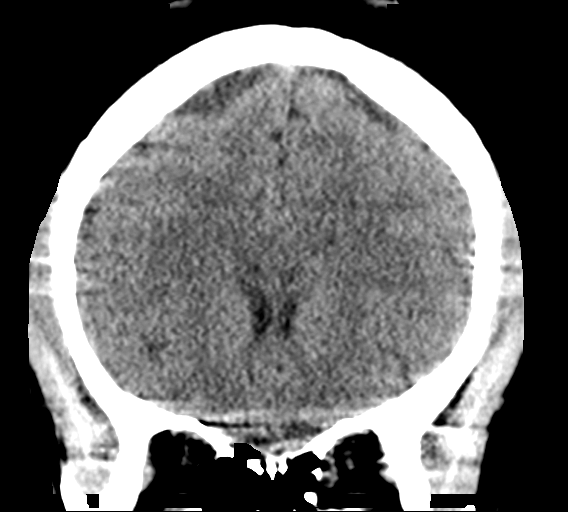
[im 30/67  brain]
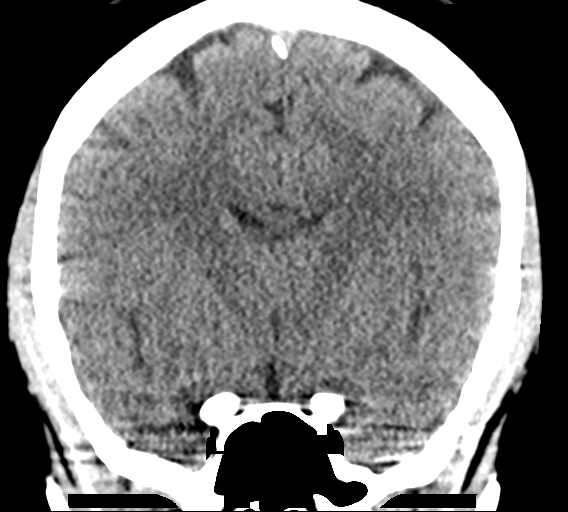
[im 37/67  brain]
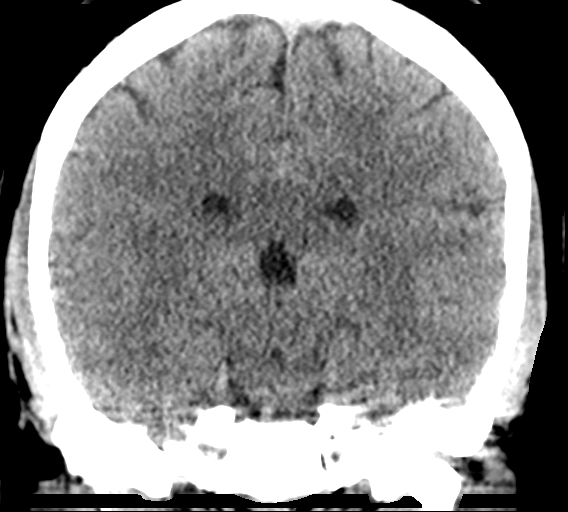

[Series 6: sagittal soft tissue · sagittal · 0.27mm/px · 3 of 51 slices shown]
[im 17/51  brain]
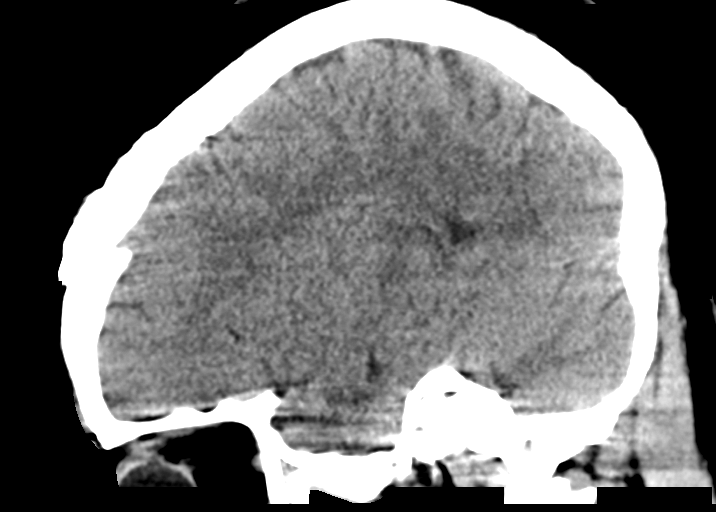
[im 26/51  brain]
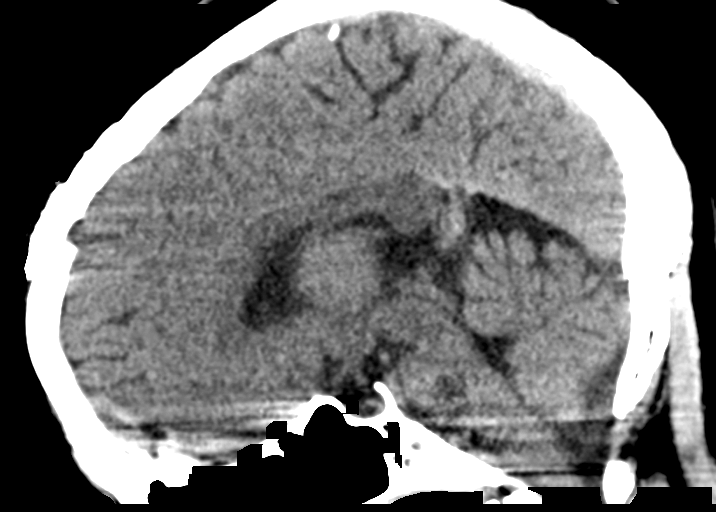
[im 34/51  brain]
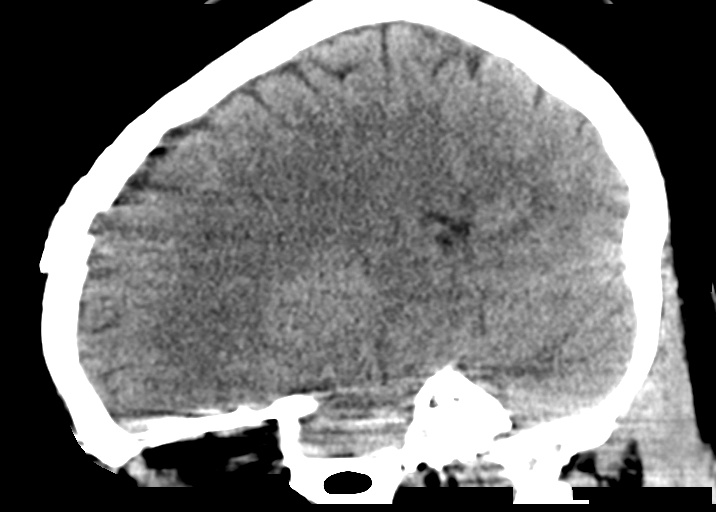

[15 of 45 positions shown; findings below may reference images not displayed]

FINDINGS: Brain: No evidence of infarction, hemorrhage, hydrocephalus,
extra-axial collection or mass lesion/mass effect.

Vascular: No hyperdense vessel or unexpected calcification.

Skull: Normal. Negative for fracture or focal lesion.

Sinuses/Orbits: Remote blowout fracture of the medial wall left
orbit, partially covered.

Other: Mild motion degradation.
IMPRESSION: 1. Normal appearance of the brain.
2. Remote left orbital blowout fracture.

## 2020-01-31 ENCOUNTER — Encounter (HOSPITAL_COMMUNITY): Payer: Self-pay

## 2020-01-31 ENCOUNTER — Ambulatory Visit (HOSPITAL_COMMUNITY)
Admission: RE | Admit: 2020-01-31 | Discharge: 2020-01-31 | Disposition: A | Discharge status: Home / Self Care | Payer: No Payment, Other

## 2020-01-31 HISTORY — DX: Manic episode, unspecified: F30.9

## 2020-01-31 HISTORY — DX: Depression, unspecified: F32.A

## 2020-01-31 NOTE — Progress Notes (Addendum)
Received Christina David this AM at the [facility], she went up stairs and made an appointment for Friday at 0800 and was told to return on Tuesday to see a psychiatrist for medication. She was released from jail on Friday and was not given any medications. She is requesting a prescription for Zyprexa 15 mg and Prozac 20 mg. She denied SI and HI. 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 She is here with her mom. She was instructed to return tomorrow for her appointment and comfortable with the decision.

## 2020-02-01 ENCOUNTER — Telehealth (HOSPITAL_COMMUNITY): Payer: Self-pay | Admitting: General Practice

## 2020-02-01 NOTE — Telephone Encounter (Signed)
Care Management - Follow Up [facility] Discharges   Phone is disconnected; unable to make contact to provide follow up

## 2020-02-01 NOTE — Telephone Encounter (Signed)
Care Management - Follow Up [facility] Discharges   Per documentation in epic.  Patient has an appt with Deloris Ping, LCSW on 02-04-2020 in the [facility].

## 2020-02-04 ENCOUNTER — Ambulatory Visit (HOSPITAL_COMMUNITY)
Admission: EM | Admit: 2020-02-04 | Discharge: 2020-02-04 | Disposition: A | Discharge status: Other Acute Inpatient Hospital | Payer: No Payment, Other

## 2020-02-04 ENCOUNTER — Other Ambulatory Visit: Payer: Self-pay

## 2020-02-04 ENCOUNTER — Ambulatory Visit (INDEPENDENT_AMBULATORY_CARE_PROVIDER_SITE_OTHER): Payer: No Payment, Other | Admitting: Clinical

## 2020-02-04 DIAGNOSIS — F1421 Cocaine dependence, in remission: Secondary | ICD-10-CM

## 2020-02-04 DIAGNOSIS — F319 Bipolar disorder, unspecified: Secondary | ICD-10-CM

## 2020-02-04 NOTE — ED Notes (Signed)
Patient with appointment Tuesday. Needed medication to last until then. Seen in out patient dept.

## 2020-02-05 NOTE — Progress Notes (Signed)
Comprehensive Clinical Assessment (CCA) Note  02/04/2020 Christina David [id number]  Chief Complaint:  Chief Complaint  Patient presents with  . Anxiety  . Depression   Visit Diagnosis: Bipolar 1 disorder; Cocaine use disorder, severe, sustained remission, in controlled environment     Client is 29 year old female presenting to [facility] for outpatient behavioral health services. Client presents by self -referral for a clinical assessment. Client reported a treatment history of Manic depression and anxiety. Client reported she was hospitalized involuntarily at age 75 and diagnosed with manic depression and then in 2019 anxiety symptoms began. Client reported endorsing symptoms of insomnia, mood swings, sadness, elevated mood, low energy, impulsivity, and anxiety. Client stated, "I can be really low and want to sleep then I can be really high and do things obsessively to the point it can harm me and other people around me". Client reported a history of cocaine use beginning at age 17 in using daily spending a large quantity of money. Client reported previous engagement with residential substance use treatment in [place] within the past year. Client reported last use in June 2021.  Client reported she has recently been released from jail and is in need of a refill for her prescribed medications of Zyprexa and Prozac.  Client presented oriented times five, appropriately dressed, and cooperative with the therapist. Client denied suicidal and homicidal ideations, hallucinations and delusions. Client was screened for the following SDOH: GAD 7 : Generalized Anxiety Score 02/04/2020  Nervous, Anxious, on Edge 2  Control/stop worrying 2  Worry too much - different things 2  Trouble relaxing 2  Restless 2  Easily annoyed or irritable 2  Afraid - awful might happen 2  Total GAD 7 Score 14  Anxiety Difficulty Extremely difficult     Counselor from 02/04/2020 in [facility]   PHQ-9 Total Score 13      Treatment recommendations: 1x weekly Dual diagnosis group for substance use and mental health, individual therapy, psychiatric evaluation and medication management.  Therapist provided information on format of appointment (virtual or face to face).   The client was advised to call back or seek an in-person evaluation if the symptoms worsen or if the condition fails to improve as anticipated before the next scheduled appointment. Client was in agreement with treatment recommendations.    CCA Biopsychosocial Intake/Chief Complaint:  Client reported she has recently been released from jail and in need ot have her psychiatric medications which include Zyprexa and Prozac to be refilled. Client reported she has a treatment history of manic depression and anxiety.  Current Symptoms/Problems: Client reported "restlessness, fidgety, maybe something could go wrong, and depressive mood swings".   Patient Reported Schizophrenia/Schizoaffective Diagnosis in Past: No data recorded  Strengths: No data recorded Preferences: Client stated, "to work on solutions to my problems".  Abilities: No data recorded  Type of Services Patient Feels are Needed: Individual therapy, group therapy, psychiatric evaluation, and medication management   Initial Clinical Notes/Concerns: No data recorded  Mental Health Symptoms Depression:  Change in energy/activity;Difficulty Concentrating;Hopelessness;Worthlessness   Duration of Depressive symptoms: Greater than two weeks   Mania:  Change in energy/activity;Increased Energy;Recklessness   Anxiety:   Worrying;Tension;Sleep;Difficulty concentrating   Psychosis:  None   Duration of Psychotic symptoms: No data recorded  Trauma:  None   Obsessions:  None   Compulsions:  None   Inattention:  None   Hyperactivity/Impulsivity:  N/A   Oppositional/Defiant Behaviors:  None   Emotional Irregularity:  None  Other  Mood/Personality Symptoms:  No data recorded   Mental Status Exam Appearance and self-care  Stature:  Average   Weight:  Average weight   Clothing:  Casual   Grooming:  Normal   Cosmetic use:  Age appropriate   Posture/gait:  Normal   Motor activity:  Not Remarkable   Sensorium  Attention:  Normal   Concentration:  Normal   Orientation:  X5   Recall/memory:  Normal   Affect and Mood  Affect:  Anxious   Mood:  Other (Comment) (Pleasant)   Relating  Eye contact:  Normal   Facial expression:  Responsive   Attitude toward examiner:  Cooperative   Thought and Language  Speech flow: Clear and Coherent   Thought content:  Appropriate to Mood and Circumstances   Preoccupation:  None   Hallucinations:  None   Organization:  No data recorded  [employer] of Knowledge:  Good   Intelligence:  Average   Abstraction:  Normal   Judgement:  Good   Reality Testing:  Adequate   Insight:  Good   Decision Making:  Normal   Social Functioning  Social Maturity:  Isolates   Social Judgement:  Normal   Stress  Stressors:  Transitions;Legal (Client reported she was recently released from jail.)   Coping Ability:  Resilient   Skill Deficits:  Communication   Supports:  Family     Religion: Religion/Spirituality Are You A Religious Person?: Yes  Leisure/Recreation: Leisure / Recreation Do You Have Hobbies?: Yes  Exercise/Diet: Exercise/Diet Do You Exercise?: No Have You Gained or Lost A Significant Amount of Weight in the Past Six Months?: No Do You Follow a Special Diet?: No Do You Have Any Trouble Sleeping?: Yes   CCA Employment/Education Employment/Work Situation: Employment / Work Situation Employment situation: Unemployed  Education: Education Did Garment/textile technologist From [employer]?: Yes (GED) Did You Attend College?: Yes What Type of College Degree Do you Have?: Client reported she completed [employer] for hair in  2017.   CCA Family/Childhood History Family and Relationship History: Family history Marital status: Single Does patient have children?: No  Childhood History:  Childhood History By whom was/is the patient raised?: Mother, Father, Grandparents Description of patient's relationship with caregiver when they were a child: Client reported her mother struggled with drug addiction during her childhood and was in and out of her life while her maternal grandmother raised her. Patient's description of current relationship with people who raised him/her: Client reported she currently lives with her mother and they have a close relationship. Client reported her father passed in 2011. Does patient have siblings?: Yes Number of Siblings: 3 Did patient suffer any verbal/emotional/physical/sexual abuse as a child?: No Did patient suffer from severe childhood neglect?: No Has patient ever been sexually abused/assaulted/raped as an adolescent or adult?: No Was the patient ever a victim of a crime or a disaster?: No Witnessed domestic violence?: No Has patient been affected by domestic violence as an adult?: No  Child/Adolescent Assessment:     CCA Substance Use Alcohol/Drug Use: Alcohol / Drug Use History of alcohol / drug use?: Yes Substance #1 Name of Substance 1: Cocaine 1 - Age of First Use: 29 years old 1 - Amount (size/oz): "binge use" 1 - Frequency: Daily use 1 - Last Use / Amount: June 2021                       ASAM's:  Six Dimensions of  Multidimensional Assessment  Dimension 1:  Acute Intoxication and/or Withdrawal Potential:   Dimension 1:  Description of individual's past and current experiences of substance use and withdrawal: Client reported history of residneital substance use treatment.  Dimension 2:  Biomedical Conditions and Complications:   Dimension 2:  Description of patient's biomedical conditions and  complications: Client reported no medical conditions  caused or affected by her history of cocaine use.  Dimension 3:  Emotional, Behavioral, or Cognitive Conditions and Complications:  Dimension 3:  Description of emotional, behavioral, or cognitive conditions and complications: Client reported a diagnosed history of manic depression and anxiety which has been controlled by use of prescribed psychotropic medications.  Dimension 4:  Readiness to Change:  Dimension 4:  Description of Readiness to Change criteria: Client is int he maintenace stage of change.  Dimension 5:  Relapse, Continued use, or Continued Problem Potential:  Dimension 5:  Relapse, continued use, or continued problem potential critiera description: Client reported last use in June 2021.  Dimension 6:  Recovery/Living Environment:  Dimension 6:  Recovery/Iiving environment criteria description: Client reported she lives in a positive and supportive living enviornment with her family.  ASAM Severity Score: ASAM's Severity Rating Score: 5  ASAM Recommended Level of Treatment: ASAM Recommended Level of Treatment: Level I Outpatient Treatment   Substance use Disorder (SUD)    Recommendations for Services/Supports/Treatments: Recommendations for Services/Supports/Treatments Recommendations For Services/Supports/Treatments: Medication Management, Individual Therapy  DSM5 Diagnoses: Patient Active Problem List   Diagnosis Date Noted  . Cocaine abuse with cocaine-induced mood disorder (HCC) 08/21/2017    Patient Centered Plan: Patient is on the following Treatment Plan(s):  Depression and Impulse Control   Referrals to Alternative Service(s): Referred to Alternative Service(s):   Place:   Date:   Time:    Referred to Alternative Service(s):   Place:   Date:   Time:    Referred to Alternative Service(s):   Place:   Date:   Time:    Referred to Alternative Service(s):   Place:   Date:   Time:     Loree Fee, LCSW

## 2020-02-08 ENCOUNTER — Other Ambulatory Visit: Payer: Self-pay

## 2020-02-08 ENCOUNTER — Ambulatory Visit (INDEPENDENT_AMBULATORY_CARE_PROVIDER_SITE_OTHER): Payer: No Payment, Other | Admitting: Physician Assistant

## 2020-02-08 ENCOUNTER — Telehealth (HOSPITAL_COMMUNITY): Payer: Self-pay | Admitting: *Deleted

## 2020-02-08 ENCOUNTER — Other Ambulatory Visit (HOSPITAL_COMMUNITY): Payer: Self-pay | Admitting: Physician Assistant

## 2020-02-08 ENCOUNTER — Encounter (HOSPITAL_COMMUNITY): Payer: Self-pay | Admitting: Physician Assistant

## 2020-02-08 VITALS — BP 133/102 | HR 83 | Ht 65.0 in | Wt 210.0 lb

## 2020-02-08 DIAGNOSIS — F3162 Bipolar disorder, current episode mixed, moderate: Secondary | ICD-10-CM

## 2020-02-08 DIAGNOSIS — F339 Major depressive disorder, recurrent, unspecified: Secondary | ICD-10-CM | POA: Diagnosis not present

## 2020-02-08 DIAGNOSIS — F1421 Cocaine dependence, in remission: Secondary | ICD-10-CM

## 2020-02-08 DIAGNOSIS — F411 Generalized anxiety disorder: Secondary | ICD-10-CM | POA: Diagnosis not present

## 2020-02-08 MED ORDER — HYDROXYZINE HCL 10 MG PO TABS: 10.0000 mg | ORAL_TABLET | Freq: Three times a day (TID) | ORAL | 1 refills | Status: DC | PRN

## 2020-02-08 MED ORDER — OLANZAPINE 15 MG PO TABS
15.0000 mg | ORAL_TABLET | Freq: Every day | ORAL | 2 refills | Status: DC
Start: 2020-02-08 — End: 2020-02-08

## 2020-02-08 MED ORDER — OLANZAPINE 5 MG PO TABS: 5.0000 mg | ORAL_TABLET | Freq: Every day | ORAL | 1 refills | Status: DC

## 2020-02-08 MED ORDER — FLUOXETINE HCL 20 MG PO CAPS: 20.0000 mg | ORAL_CAPSULE | Freq: Every morning | ORAL | 2 refills | Status: DC

## 2020-02-08 MED ORDER — OLANZAPINE 10 MG PO TABS: 10.0000 mg | ORAL_TABLET | Freq: Every day | ORAL | 2 refills | Status: DC

## 2020-02-08 MED ORDER — OLANZAPINE 10 MG PO TABS: 10.0000 mg | ORAL_TABLET | Freq: Every day | ORAL | 1 refills | Status: DC

## 2020-02-08 MED ORDER — OLANZAPINE 15 MG PO TABS: 15.0000 mg | ORAL_TABLET | Freq: Every day | ORAL | 2 refills | Status: DC

## 2020-02-08 MED FILL — OLANZapine 15 MG TABS: 15 | 30 days supply | Qty: 30 | Fill #0

## 2020-02-08 NOTE — Addendum Note (Signed)
Addended by: Meta Hatchet on: 02/08/2020 03:48 PM   Modules accepted: Orders
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
## 2020-02-08 NOTE — Telephone Encounter (Signed)
[facility] questioning the olanzapine rx. Cheaper to dispense 15 mg rather than the 10 and 5 mg, order changed and resent.

## 2020-02-08 NOTE — Addendum Note (Signed)
Addended by: Wynona Luna on: 02/08/2020 04:57 PM   Modules accepted: Orders

## 2020-02-23 NOTE — Telephone Encounter (Signed)
Writer verified that order for Olanzapine was changed and resent.

## 2020-03-14 ENCOUNTER — Other Ambulatory Visit: Payer: Self-pay

## 2020-03-14 ENCOUNTER — Encounter (HOSPITAL_COMMUNITY): Payer: Self-pay | Admitting: Physician Assistant

## 2020-03-14 ENCOUNTER — Other Ambulatory Visit (HOSPITAL_COMMUNITY): Payer: Self-pay | Admitting: Physician Assistant

## 2020-03-14 ENCOUNTER — Ambulatory Visit (INDEPENDENT_AMBULATORY_CARE_PROVIDER_SITE_OTHER): Payer: No Payment, Other | Admitting: Physician Assistant

## 2020-03-14 VITALS — BP 122/81 | HR 74 | Ht 69.0 in | Wt 225.0 lb

## 2020-03-14 DIAGNOSIS — F411 Generalized anxiety disorder: Secondary | ICD-10-CM

## 2020-03-14 DIAGNOSIS — F99 Mental disorder, not otherwise specified: Secondary | ICD-10-CM

## 2020-03-14 DIAGNOSIS — F5105 Insomnia due to other mental disorder: Secondary | ICD-10-CM | POA: Insufficient documentation

## 2020-03-14 DIAGNOSIS — F3162 Bipolar disorder, current episode mixed, moderate: Secondary | ICD-10-CM | POA: Diagnosis not present

## 2020-03-14 MED ORDER — ARIPIPRAZOLE 5 MG PO TABS: 5.0000 mg | ORAL_TABLET | Freq: Every day | ORAL | 1 refills | Status: DC

## 2020-03-14 MED ORDER — TRAZODONE HCL 50 MG PO TABS: 50.0000 mg | ORAL_TABLET | Freq: Every day | ORAL | 1 refills | Status: DC

## 2020-03-14 NOTE — Progress Notes (Signed)
BH MD/PA/NP OP Progress Note  03/14/2020 11:16 PM [name]  MRN:  [id number]  Chief Complaint:  Chief Complaint    Medication Management     HPI:  Christina David is a 29 year old female with a past psychiatric history significant for bipolar disorder, anxiety, and insomnia who presents to [facility] for medication management.  Patient is currently being managed on the following medications:  Olanzapine 15 mg at bedtime Fluoxetine 20 mg daily Hydroxyzine 10 mg 3 times daily as needed  Patient reports that she has not been doing too well with her current regimen of medications.  Patient reports that she has gained weight and that she does not feel motivated.  Patient endorses that she has missed a couple of days taking her medications but has been mostly consistent.  Patient states that she has been sleeping longer hours since taking the medication.  She states that she will go to bed at night only to wake up at 2 in the afternoon. Patient reports that her mood has been miserable and she does not like doing the activities that she is used to doing.  Patient endorses that she was originally walking every day but is now too tired to walk.  She expresses that she now has to muster up the motivation just to do things around the house.  Patient believes that the Prozac has been helpful but it has not helped with the way she is currently feeling.  Patient also attributes her depressed mood to having few reliable friends and lack of support.  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  Patient contributes her relapse to hanging out with the wrong crowd.  Patient denies suicidal and homicidal ideation.  She further denies auditory and visual hallucinations.  Patient endorses getting on average 12 hours of sleep a day.  Patient endorses increased appetite and describes having "double meals."  Patient endorses  eating on average 4 meals a day.  Patient denies alcohol consumption.  She endorses smoking 3 to 4 cigarettes a day. Patient denies current illicit drug use but reports that she used cocaine a little over a week ago.  Patient is interested being placed on a medication that does not promote weight gain.  Visit Diagnosis:    ICD-10-CM   1. Bipolar disorder, current episode mixed, moderate (HCC)  F31.62 ARIPiprazole (ABILIFY) 5 MG tablet  2. Anxiety state  F41.1   3. Insomnia due to other mental disorder  F51.05 traZODone (DESYREL) 50 MG tablet   F99     Past Psychiatric History: Bipolar disorder Anxiety Insomnia  Past Medical History:  Past Medical History:  Diagnosis Date  . Asthma   . Depression   . Mania (HCC)   . Mental disorder    Never had Md diagnosis    Past Surgical History:  Procedure Laterality Date  . TONSILECTOMY, ADENOIDECTOMY, BILATERAL MYRINGOTOMY AND TUBES    . UMBILICAL HERNIA REPAIR      Family Psychiatric History:  Mother struggled with drug addiction  Family History:  Family History  Problem Relation Age of Onset  . Hypertension Mother     Social History:  Social History   Socioeconomic History  . Marital status: Single    Spouse name: Not on file  . Number of children: Not on file  . Years of education: Not on file  . Highest education level: Not on file  Occupational History  .  Not on file  Tobacco Use  . Smoking status: Current Every Day Smoker    Packs/day: 0.25    Types: Cigarettes  . Smokeless tobacco: Never Used  Substance and Sexual Activity  . Alcohol use: No  . Drug use: Yes    Types: Marijuana    Comment: states does not smoke it anymore  . Sexual activity: Yes    Birth control/protection: None  Other Topics Concern  . Not on file  Social History Narrative  . Not on file   Social Determinants of Health   Financial Resource Strain: Not on file  Food Insecurity: Not on file  Transportation Needs: Not on file  Physical  Activity: Not on file  Stress: Not on file  Social Connections: Not on file    Allergies:  Allergies  Allergen Reactions  . Lamictal [Lamotrigine]   . Bee Venom Swelling  . Trileptal [Oxcarbazepine] Hives, Swelling and Other (See Comments)    Headaches     Metabolic Disorder Labs: No results found for: HGBA1C, MPG No results found for: PROLACTIN No results found for: CHOL, TRIG, HDL, CHOLHDL, VLDL, LDLCALC No results found for: TSH  Therapeutic Level Labs: No results found for: LITHIUM No results found for: VALPROATE No components found for:  CBMZ  Current Medications: Current Outpatient Medications  Medication Sig Dispense Refill  . [START ON 03/27/2020] ARIPiprazole (ABILIFY) 5 MG tablet Take 1 tablet (5 mg total) by mouth daily. 30 tablet 1  . FLUoxetine (PROZAC) 20 MG capsule Take 20 mg by mouth daily.    . hydrOXYzine (ATARAX/VISTARIL) 10 MG tablet Take 1 tablet (10 mg total) by mouth 3 (three) times daily as needed for anxiety. 90 tablet 1  . OLANZapine (ZYPREXA) 15 MG tablet Take 1 tablet (15 mg total) by mouth at bedtime. 30 tablet 2  . [START ON 03/27/2020] traZODone (DESYREL) 50 MG tablet Take 1 tablet (50 mg total) by mouth at bedtime. 30 tablet 1   No current facility-administered medications for this visit.     Musculoskeletal: Strength & Muscle Tone: within normal limits Gait & Station: normal Patient leans: N/A  Psychiatric Specialty Exam: Review of Systems  Psychiatric/Behavioral: Negative for hallucinations, self-injury and suicidal ideas. Sleep disturbance: Patient endorses too much sleep. The patient is not nervous/anxious.     Blood pressure 122/81, pulse 74, height 5\' 9"  (1.753 m), weight 225 lb (102.1 kg), SpO2 96 %.Body mass index is 33.23 kg/m.  General Appearance: Fairly Groomed and Neat  Eye Contact:  Good  Speech:  Clear and Coherent and Normal Rate  Volume:  Normal  Mood:  Euthymic  Affect:  Appropriate  Thought Process:  Coherent,  Goal Directed and Descriptions of Associations: Intact  Orientation:  Full (Time, Place, and Person)  Thought Content: WDL   Suicidal Thoughts:  No  Homicidal Thoughts:  No  Memory:  Immediate;   Good Recent;   Good Remote;   Good  Judgement:  Fair  Insight:  Fair  Psychomotor Activity:  Normal  Concentration:  Concentration: Good and Attention Span: Good  Recall:  Good  Fund of Knowledge: Fair  Language: Good  Akathisia:  NA  Handed:  Right  AIMS (if indicated): not done  Assets:  Communication Skills Desire for Improvement Housing Social Support  ADL's:  Intact  Cognition: WNL  Sleep:  Fair   Screenings: GAD-7   from 02/04/2020 in [facility]  Total GAD-7 Score 14    PHQ2-9  Flowsheet Row Counselor from 02/04/2020 in [facility]  PHQ-2 Total Score 5  PHQ-9 Total Score 13       Assessment and Plan:   Charis O. Oscarson is a 29 year old female with a past psychiatric history significant for bipolar disorder, anxiety, and insomnia who presents to [facility] for medication management.  Patient is currently being managed on the following medications:  Olanzapine 15 mg at bedtime Fluoxetine 20 mg daily Hydroxyzine 10 mg 3 times daily as needed  Patient reports that she has gained weight and that she does not feel motivated. Patient reports that her mood has been miserable and she does not like doing the activities that she is used to doing such as going on walks. Patient also states that she has been sleeping long hours (roughly 12 hours). Patient is interested in being placed on another mood stabilizing agent that doesn't cause as much weight gain.  Patient was recommended Abilify 5 mg for the management of her mood.  Patient was informed that Abilify would not cause as much weight gain.  Patient was agreeable to plan.  Patient was advised to be  placed on trazodone 50 mg at bedtime since Abilify did not have efficacy in promoting sleep.  Patient was informed that trazodone has some efficacy in aiding in the promotion of sleep.  Patient was agreeable to plan.  Since patient is currently on a high-dose of olanzapine (15 mg), patient was recommended to titrate down on her olanzapine for roughly 2 weeks before starting her Abilify and trazodone.  Patient was agreeable to recommendation.  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  Patient will start taking Abilify 5 mg and trazodone 50 mg on January 10th.  Patient will continue taking hydroxyzine 10 mg 3 times daily as needed for anxiety.  1. Bipolar disorder, current episode mixed, moderate (HCC) Patient was advised to titrate down on her Olanzapine before starting Abilify. Patient to continue taking Fluoxetine 20 mg as prescribed  - ARIPiprazole (ABILIFY) 5 MG tablet; Take 1 tablet (5 mg total) by mouth daily.  Dispense: 30 tablet; Refill: 1  2. Anxiety state Patient will continue taking hydroxyzine 10 mg 3 times daily as needed for anxiety.  3. Insomnia due to other mental disorder  - traZODone (DESYREL) 50 MG tablet; Take 1 tablet (50 mg total) by mouth at bedtime.  Dispense: 30 tablet; Refill: 1  Patient to follow-up in 5 weeks Meta Hatchet, PA 03/14/2020, 11:16 PM

## 2020-03-15 ENCOUNTER — Encounter (HOSPITAL_COMMUNITY): Payer: Self-pay | Admitting: Physician Assistant

## 2020-03-15 MED FILL — ARIPiprazole 5 MG TABS: 5 | 30 days supply | Qty: 30 | Fill #0

## 2020-03-15 MED FILL — traZODone HCL 50 MG TABS: 50 | 30 days supply | Qty: 30 | Fill #0

## 2020-03-28 ENCOUNTER — Ambulatory Visit (HOSPITAL_COMMUNITY): Payer: No Payment, Other | Admitting: Clinical

## 2020-03-31 ENCOUNTER — Ambulatory Visit (HOSPITAL_COMMUNITY): Payer: No Payment, Other | Admitting: Clinical

## 2020-04-10 MED FILL — traZODone HCL 50 MG TABS: 50 | 30 days supply | Qty: 30 | Fill #0

## 2020-04-10 MED FILL — ARIPiprazole 5 MG TABS: 5 | 30 days supply | Qty: 30 | Fill #0

## 2020-04-19 ENCOUNTER — Ambulatory Visit (HOSPITAL_COMMUNITY): Payer: No Payment, Other | Admitting: Physician Assistant

## 2020-04-25 ENCOUNTER — Ambulatory Visit (HOSPITAL_COMMUNITY): Payer: Self-pay | Admitting: Clinical

## 2020-05-31 ENCOUNTER — Ambulatory Visit (HOSPITAL_COMMUNITY): Payer: No Payment, Other | Admitting: Physician Assistant

## 2020-08-11 ENCOUNTER — Other Ambulatory Visit: Payer: Self-pay

## 2020-08-11 ENCOUNTER — Ambulatory Visit (INDEPENDENT_AMBULATORY_CARE_PROVIDER_SITE_OTHER): Payer: No Payment, Other | Admitting: Physician Assistant

## 2020-08-11 ENCOUNTER — Encounter (HOSPITAL_COMMUNITY): Payer: Self-pay | Admitting: Physician Assistant

## 2020-08-11 DIAGNOSIS — F5105 Insomnia due to other mental disorder: Secondary | ICD-10-CM

## 2020-08-11 DIAGNOSIS — F99 Mental disorder, not otherwise specified: Secondary | ICD-10-CM | POA: Diagnosis not present

## 2020-08-11 DIAGNOSIS — F411 Generalized anxiety disorder: Secondary | ICD-10-CM

## 2020-08-11 DIAGNOSIS — F3162 Bipolar disorder, current episode mixed, moderate: Secondary | ICD-10-CM

## 2020-08-11 MED ORDER — TRAZODONE HCL 50 MG PO TABS
ORAL_TABLET | Freq: Every day | ORAL | 1 refills | Status: DC
  Filled 2020-08-11: qty 30, 30d supply, fill #0

## 2020-08-11 MED ORDER — ARIPIPRAZOLE 5 MG PO TABS
5.0000 mg | ORAL_TABLET | Freq: Every day | ORAL | 1 refills | Status: DC
  Filled 2020-08-11: qty 30, 30d supply, fill #0

## 2020-08-11 MED ORDER — FLUOXETINE HCL 20 MG PO CAPS
20.0000 mg | ORAL_CAPSULE | Freq: Every day | ORAL | 1 refills | Status: DC
  Filled 2020-08-11: qty 30, 30d supply, fill #0

## 2020-08-11 MED ORDER — HYDROXYZINE HCL 10 MG PO TABS
10.0000 mg | ORAL_TABLET | Freq: Three times a day (TID) | ORAL | 1 refills | Status: DC | PRN
  Filled 2020-08-11: qty 90, 30d supply, fill #0

## 2020-08-11 NOTE — Progress Notes (Signed)
BH MD/PA/NP OP Progress Note  08/11/2020 8:57 PM Christina David  MRN:  [id number]  Chief Complaint: Follow up and medication management  HPI:   Christina David is a 30 year old female with a past psychiatric history significant for bipolar disorder, insomnia, and anxiety who presents to [facility] for follow-up and medication management.  Patient is being managed on the following medications:  Abilify 5 mg daily Hydroxyzine 10 mg 3 times daily as needed Fluoxetine 20 mg daily Trazodone 50 mg at bedtime  Patient was last seen on December 28th, 2021.  Since being seen in December, patient states that she is on 3 years probation with community work and has been 39 days clean from illicit drug use.  Patient states that the medications have been helpful in managing her symptoms and keep her at a sedated state.  Patient is requesting refills due to being out.  When not on her medications, patient states she experiences fluctuating mood, over thinking, and racing thoughts.  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  Patient has also been trying to read more books as a form of self therapy.  Patient states that she will occasionally feel lonely and suffer from crying spells when the thoughts get the best of her.  Patient is pleasant, calm, cooperative, and fully engaged in conversation during the encounter.  Patient states that she is currently in a very good mood and would like to continue on the current track she is in.  Patient denies suicidal or homicidal ideations.  She further denies auditory or visual hallucinations and does not appear to be responding to internal/external stimuli.  Patient endorses fair sleep and receives on average 5 to 6 hours of intermittent sleep.  Patient states that whenever she does not get enough  sleep she will "crash."  Patient endorses poor appetite and eats on average 1 meal a day with some snacking.  Patient denies alcohol consumption and illicit drug use.  She reports tobacco use and smokes on average 5 to 6 cigarettes/day.  A PHQ-9 screen was performed with the patient scoring a 21.  A GAD-7 screen was also performed with the patient scoring a 20.  A [place] Suicide Severity Rating Scale was initiated with the patient being considered high risk. Patient denied being a danger to herself and was able to contract for safety following the conclusion of the encounter.  Visit Diagnosis:    ICD-10-CM   1. Bipolar disorder, current episode mixed, moderate (HCC)  F31.62 ARIPiprazole (ABILIFY) 5 MG tablet    FLUoxetine (PROZAC) 20 MG capsule  2. Insomnia due to other mental disorder  F51.05 traZODone (DESYREL) 50 MG tablet   F99   3. Anxiety state  F41.1 FLUoxetine (PROZAC) 20 MG capsule    hydrOXYzine (ATARAX/VISTARIL) 10 MG tablet    Past Psychiatric History:  Bipolar disorder Generalized Anxiety Disorder Insomnia  Past Medical History:  Past Medical History:  Diagnosis Date  . Asthma   . Depression   . Mania (HCC)   . Mental disorder    Never had Md diagnosis    Past Surgical History:  Procedure Laterality Date  . TONSILECTOMY, ADENOIDECTOMY, BILATERAL MYRINGOTOMY AND TUBES    . UMBILICAL HERNIA REPAIR      Family Psychiatric History:  Mother struggled with drug addiction  Family History:  Family History  Problem Relation  Age of Onset  . Hypertension Mother     Social History:  Social History   Socioeconomic History  . Marital status: Single    Spouse name: Not on file  . Number of children: Not on file  . Years of education: Not on file  . Highest education level: Not on file  Occupational History  . Not on file  Tobacco Use  . Smoking status: Current Every Day Smoker    Packs/day: 0.25    Types: Cigarettes  . Smokeless tobacco: Never Used   Substance and Sexual Activity  . Alcohol use: No  . Drug use: Yes    Types: Marijuana    Comment: states does not smoke it anymore  . Sexual activity: Yes    Birth control/protection: None  Other Topics Concern  . Not on file  Social History Narrative  . Not on file   Social Determinants of Health   Financial Resource Strain: Not on file  Food Insecurity: Not on file  Transportation Needs: Not on file  Physical Activity: Not on file  Stress: Not on file  Social Connections: Not on file    Allergies:  Allergies  Allergen Reactions  . Lamictal [Lamotrigine]   . Bee Venom Swelling  . Trileptal [Oxcarbazepine] Hives, Swelling and Other (See Comments)    Headaches     Metabolic Disorder Labs: No results found for: HGBA1C, MPG No results found for: PROLACTIN No results found for: CHOL, TRIG, HDL, CHOLHDL, VLDL, LDLCALC No results found for: TSH  Therapeutic Level Labs: No results found for: LITHIUM No results found for: VALPROATE No components found for:  CBMZ  Current Medications: Current Outpatient Medications  Medication Sig Dispense Refill  . ARIPiprazole (ABILIFY) 5 MG tablet Take 1 tablet (5 mg total) by mouth daily. 30 tablet 1  . FLUoxetine (PROZAC) 20 MG capsule Take 1 capsule (20 mg total) by mouth daily. 30 capsule 1  . hydrOXYzine (ATARAX/VISTARIL) 10 MG tablet Take 1 tablet (10 mg total) by mouth 3 (three) times daily as needed for anxiety. 90 tablet 1  . traZODone (DESYREL) 50 MG tablet TAKE 1 TABLET (50 MG TOTAL) BY MOUTH AT BEDTIME. 30 tablet 1   No current facility-administered medications for this visit.     Musculoskeletal: Strength & Muscle Tone: within normal limits Gait & Station: normal Patient leans: N/A  Psychiatric Specialty Exam: Review of Systems  Psychiatric/Behavioral: Positive for sleep disturbance. Negative for decreased concentration, dysphoric mood, hallucinations, self-injury and suicidal ideas. The patient is  nervous/anxious. The patient is not hyperactive.     There were no vitals taken for this visit.There is no height or weight on file to calculate BMI.  General Appearance: Well Groomed  Eye Contact:  Good  Speech:  Clear and Coherent and Normal Rate  Volume:  Normal  Mood:  Anxious and Euthymic  Affect:  Appropriate and Congruent  Thought Process:  Coherent, Goal Directed and Descriptions of Associations: Intact  Orientation:  Full (Time, Place, and Person)  Thought Content: WDL and Logical   Suicidal Thoughts:  No  Homicidal Thoughts:  No  Memory:  Immediate;   Good Recent;   Good Remote;   Good  Judgement:  Good  Insight:  Good  Psychomotor Activity:  Normal  Concentration:  Concentration: Good and Attention Span: Good  Recall:  Good  Fund of Knowledge: Fair  Language: Good  Akathisia:  NA  Handed:  Right  AIMS (if indicated): not done  Assets:  Manufacturing systems engineer  Desire for Improvement Housing Social Support  ADL's:  Intact  Cognition: WNL  Sleep:  Fair   Screenings: GAD-7   Flowsheet Row Office Visit from 08/11/2020 in [facility] Counselor from 02/04/2020 in [facility]  Total GAD-7 Score 20 14    PHQ2-9   Flowsheet Row Office Visit from 08/11/2020 in [facility] Counselor from 02/04/2020 in [facility]  PHQ-2 Total Score 5 5  PHQ-9 Total Score 21 13    Flowsheet Row Office Visit from 08/11/2020 in [facility] from 08/20/2017 in [facility]  C-SSRS RISK CATEGORY High Risk High Risk       Assessment and Plan:  Christina David is a 30 year old female with a past psychiatric history significant for bipolar disorder, insomnia, and anxiety who presents to [facility] for follow-up and medication management.  Patient was last seen on December 28th,  2021 and placed on the following medications: Abilify 5 mg daily, hydroxyzine 10 mg 3 times daily as needed, trazodone 50 mg at bedtime, and fluoxetine 20 mg daily.  Patient states that she has run out of her current prescriptions and is requesting refills.  Patient endorses the following symptoms: fluctuating mood, overthinking, racing thoughts, and crying spells.  Patient's medications to be e-prescribed to pharmacy of choice.  1. Bipolar disorder, current episode mixed, moderate (HCC)  - ARIPiprazole (ABILIFY) 5 MG tablet; Take 1 tablet (5 mg total) by mouth daily.  Dispense: 30 tablet; Refill: 1 - FLUoxetine (PROZAC) 20 MG capsule; Take 1 capsule (20 mg total) by mouth daily.  Dispense: 30 capsule; Refill: 1  2. Insomnia due to other mental disorder  - traZODone (DESYREL) 50 MG tablet; TAKE 1 TABLET (50 MG TOTAL) BY MOUTH AT BEDTIME.  Dispense: 30 tablet; Refill: 1  3. Anxiety state  - FLUoxetine (PROZAC) 20 MG capsule; Take 1 capsule (20 mg total) by mouth daily.  Dispense: 30 capsule; Refill: 1 - hydrOXYzine (ATARAX/VISTARIL) 10 MG tablet; Take 1 tablet (10 mg total) by mouth 3 (three) times daily as needed for anxiety.  Dispense: 90 tablet; Refill: 1  Patient to follow-up in 6 weeks  Meta Hatchet, PA 08/11/2020, 8:57 PM

## 2020-08-15 ENCOUNTER — Other Ambulatory Visit: Payer: Self-pay

## 2020-08-31 ENCOUNTER — Other Ambulatory Visit: Payer: Self-pay

## 2020-08-31 ENCOUNTER — Ambulatory Visit: Payer: Medicaid Other | Admitting: Physician Assistant

## 2020-08-31 VITALS — BP 127/86 | HR 106 | Temp 98.2°F | Resp 18 | Ht 66.0 in | Wt 218.0 lb

## 2020-08-31 DIAGNOSIS — Z79899 Other long term (current) drug therapy: Secondary | ICD-10-CM | POA: Insufficient documentation

## 2020-08-31 DIAGNOSIS — F3162 Bipolar disorder, current episode mixed, moderate: Secondary | ICD-10-CM

## 2020-08-31 DIAGNOSIS — L309 Dermatitis, unspecified: Secondary | ICD-10-CM

## 2020-08-31 MED ORDER — CETIRIZINE HCL 10 MG PO TABS
10.0000 mg | ORAL_TABLET | Freq: Every day | ORAL | 11 refills | Status: AC
  Filled 2020-08-31 – 2021-07-17 (×3): qty 30, 30d supply, fill #0

## 2020-08-31 MED ORDER — TRIAMCINOLONE ACETONIDE 0.1 % EX CREA
1.0000 "application " | TOPICAL_CREAM | Freq: Two times a day (BID) | CUTANEOUS | 0 refills | Status: DC
  Filled 2020-08-31: qty 454, 30d supply, fill #0

## 2020-08-31 MED ORDER — METHYLPREDNISOLONE ACETATE 80 MG/ML IJ SUSP
80.0000 mg | Freq: Once | INTRAMUSCULAR | Status: AC
  Administered 2020-08-31: 80 mg via INTRAMUSCULAR

## 2020-08-31 NOTE — Progress Notes (Signed)
Patient has taken medication today and patient has eaten today. Patient denies pain at this time. Patient reports new onset of eczema which runs in her family but skips a generation. Patient

## 2020-08-31 NOTE — Progress Notes (Signed)
New Patient Office Visit  Subjective:  Patient ID: Christina David, female    DOB: 09-27-1990  Age: 30 y.o. MRN: [id number]  CC:  Chief Complaint  Patient presents with   Eczema    Bilateral arms    HPI Teren O Mcelveen states that she started having rash on upper arms, wrists, upper thighs and back about 6 months ago.  Denies any new medications, fragrances, lotions, detergents,  States that she was given triamcinolone cream from her aunt with relief.  States that she ran out of the cream and the rash on her upper arms has returned.    Reports that she is followed by behavioral health for her psych meds.    No other concerns at this time .       Past Medical History:  Diagnosis Date   Asthma    Depression    Mania (HCC)    Mental disorder    Never had Md diagnosis    Past Surgical History:  Procedure Laterality Date   TONSILECTOMY, ADENOIDECTOMY, BILATERAL MYRINGOTOMY AND TUBES     UMBILICAL HERNIA REPAIR      Family History  Problem Relation Age of Onset   Hypertension Mother     Social History   Socioeconomic History   Marital status: Single    Spouse name: Not on file   Number of children: Not on file   Years of education: Not on file   Highest education level: Not on file  Occupational History   Not on file  Tobacco Use   Smoking status: Every Day    Packs/day: 0.25    Pack years: 0.00    Types: Cigarettes   Smokeless tobacco: Never  Substance and Sexual Activity   Alcohol use: No   Drug use: Yes    Types: Marijuana    Comment: states does not smoke it anymore   Sexual activity: Yes    Birth control/protection: None  Other Topics Concern   Not on file  Social History Narrative   Not on file   Social Determinants of Health   Financial Resource Strain: Not on file  Food Insecurity: Not on file  Transportation Needs: Not on file  Physical Activity: Not on file  Stress: Not on file  Social Connections: Not on file  Intimate Partner  Violence: Not on file    ROS Review of Systems  Constitutional: Negative.   HENT: Negative.    Eyes: Negative.   Respiratory:  Negative for shortness of breath.   Cardiovascular:  Negative for chest pain.  Endocrine: Negative.   Genitourinary: Negative.   Musculoskeletal: Negative.   Skin:  Positive for rash.  Allergic/Immunologic: Negative.   Neurological: Negative.   Hematological: Negative.   Psychiatric/Behavioral: Negative.     Objective:   Today's Vitals: BP 127/86 (BP Location: Left Arm, Patient Position: Sitting, Cuff Size: Normal)   Pulse (!) 106   Temp 98.2 F (36.8 C) (Oral)   Resp 18   Ht 5\' 6"  (1.676 m)   Wt 218 lb (98.9 kg)   LMP 08/16/2020   SpO2 100%   BMI 35.19 kg/m   Physical Exam Vitals and nursing note reviewed.  Constitutional:      Appearance: Normal appearance.  HENT:     Head: Normocephalic and atraumatic.     Right Ear: External ear normal.     Left Ear: External ear normal.     Nose: Nose normal.     Mouth/Throat:  Pharynx: Oropharynx is clear.  Eyes:     Extraocular Movements: Extraocular movements intact.     Pupils: Pupils are equal, round, and reactive to light.  Cardiovascular:     Rate and Rhythm: Normal rate and regular rhythm.     Pulses: Normal pulses.     Heart sounds: Normal heart sounds.  Pulmonary:     Effort: Pulmonary effort is normal.     Breath sounds: Normal breath sounds.  Musculoskeletal:        General: Normal range of motion.     Cervical back: Normal range of motion and neck supple.  Skin:    General: Skin is warm and dry.     Comments: See photo; rash present on left upper arm  Neurological:     General: No focal deficit present.     Mental Status: She is oriented to person, place, and time.  Psychiatric:        Mood and Affect: Mood normal.        Behavior: Behavior normal.        Thought Content: Thought content normal.        Judgment: Judgment normal.     Assessment & Plan:   Problem List  Items Addressed This Visit       Musculoskeletal and Integument   Eczema - Primary   Relevant Medications   triamcinolone cream (KENALOG) 0.1 %   cetirizine (ZYRTEC ALLERGY) 10 MG tablet     Other   Bipolar disorder, current episode mixed, moderate (HCC)   Long term current use of antipsychotic medication   Relevant Orders   CBC with Differential/Platelet   Comp. Metabolic Panel (12)   Lipid panel   TSH   Vitamin D, 25-hydroxy    Outpatient Encounter Medications as of 08/31/2020  Medication Sig   ARIPiprazole (ABILIFY) 5 MG tablet Take 1 tablet (5 mg total) by mouth daily.   cetirizine (ZYRTEC ALLERGY) 10 MG tablet Take 1 tablet (10 mg total) by mouth daily.   FLUoxetine (PROZAC) 20 MG capsule Take 1 capsule (20 mg total) by mouth daily.   hydrOXYzine (ATARAX/VISTARIL) 10 MG tablet Take 1 tablet (10 mg total) by mouth 3 (three) times daily as needed for anxiety.   traZODone (DESYREL) 50 MG tablet TAKE 1 TABLET (50 MG TOTAL) BY MOUTH AT BEDTIME.   triamcinolone cream (KENALOG) 0.1 % Apply 1 application topically 2 (two) times daily.   [EXPIRED] methylPREDNISolone acetate (DEPO-MEDROL) injection 80 mg    No facility-administered encounter medications on file as of 08/31/2020.   1. Eczema, unspecified type Trial Kenalog, patient education given on supportive care.    Patient given appt to establish care at [facility], application for CAFA  Patient to return to [facility] for fasting labs - triamcinolone cream (KENALOG) 0.1 %; Apply 1 application topically 2 (two) times daily.  Dispense: 453 g; Refill: 0 - cetirizine (ZYRTEC ALLERGY) 10 MG tablet; Take 1 tablet (10 mg total) by mouth daily.  Dispense: 30 tablet; Refill: 11 - methylPREDNISolone acetate (DEPO-MEDROL) injection 80 mg  2. Bipolar disorder, current episode mixed, moderate (HCC)   3. Long term current use of antipsychotic medication  - CBC with Differential/Platelet; Future - Comp. Metabolic Panel (12); Future - Lipid panel;  Future - TSH; Future - Vitamin D, 25-hydroxy; Future   I have reviewed the patient's medical history (PMH, PSH, Social History, Family History, Medications, and allergies) , and have been updated if relevant. I spent 32 minutes reviewing chart and  face  to face time with patient.    Follow-up: Return in about 5 days (around 09/05/2020) for Fasting  labs.   Kasandra Knudsen Mayers, PA-C

## 2020-08-31 NOTE — Patient Instructions (Signed)
I encourage you to start taking Zyrtec on a daily basis, I sent the prescription cream to the pharmacy.  Please return to the mobile unit for fasting labs.  As soon as your results are available we will call you to discuss.  Please let us know if there is anything else we can do for you.  Roney Jaffe, PA-C Physician Assistant [facility] [email or web address]   Eczema Eczema refers to a group of skin conditions that cause skin to become rough and inflamed. Each type of eczema has different triggers, symptoms, and treatments.Eczema of any type is usually itchy. Symptoms range from mild to severe. Eczema is not spread from person to person (is not contagious). It can appear on different parts of the body at different times. Oneperson's eczema may look different from another person's eczema. What are the causes? The exact cause of this condition is not known. However, exposure to certainenvironmental factors, irritants, and allergens can make the condition worse. What are the signs or symptoms? Symptoms of this condition depend on the type of eczema you have. The types include: Contact dermatitis. There are two kinds: Irritant contact dermatitis. This happens when something irritates the skin and causes a rash. Allergic contact dermatitis. This happens when your skin comes in contact with something you are allergic to (allergens). This can include poison ivy, chemicals, or medicines that were applied to your skin. Atopic dermatitis. This is a long-term (chronic) skin disease that keeps coming back (recurring). It is the most common type of eczema. Usual symptoms are a red rash and itchy, dry, scaly skin. It usually starts showing signs in infancy and can last through adulthood. Dyshidrotic eczema. This is a form of eczema on the hands and feet. It shows up as very itchy, fluid-filled blisters. It can affect people of any age but is more  common before age 74. Hand eczema. This causes very itchy areas of skin on the palms and sides of the hands and fingers. This type of eczema is common in industrial jobs where you may be exposed to different types of irritants. Lichen simplex chronicus. This type of eczema occurs when a person constantly scratches one area of the body. Repeated scratching of the area leads to thickened skin (lichenification). This condition can accompany other types of eczema. It is more common in adults but may also be seen in children. Nummular eczema. This is a common type of eczema that most often affects the lower legs and the backs of the hands. It typically causes an itchy, red, circular, crusty lesion (plaque). Scratching may become a habit and can cause bleeding. Nummular eczema occurs most often in middle-aged or older people. Seborrheic dermatitis. This is a common skin disease that mainly affects the scalp. It may also affect other oily areas of the body, such as the face, sides of the nose, eyebrows, ears, eyelids, and chest. It is marked by small scaling and redness of the skin (erythema). This can affect people of all ages. In infants, this condition is called cradle cap. Stasis dermatitis. This is a common skin disease that can cause itching, scaling, and hyperpigmentation, usually on the legs and feet. It occurs most often in people who have a condition that prevents blood from being pumped through the veins in the legs (chronic venous insufficiency). Stasis dermatitis is a chronic condition that needs long-term management. How is this diagnosed? This condition may be diagnosed based on: A physical exam of your skin. Your medical history.  Skin patch tests. These tests involve using patches that contain possible allergens and placing them on your back. Your health care provider will check in a few days to see if an allergic reaction occurred. How is this treated? Treatment for eczema is based on the type  of eczema you have. You may be given hydrocortisone steroid medicine or antihistamines. These can relieve itching quickly and help reduce inflammation. These may be prescribed or purchased overthe counter, depending on the strength that is needed. Follow these instructions at home: Take or apply over-the-counter and prescription medicines only as told by your health care provider. Use creams or ointments to moisturize your skin. Do not use lotions. Learn what triggers or irritates your symptoms so you can avoid these things. Treat symptom flare-ups quickly. Do not scratch your skin. This can make your rash worse. Keep all follow-up visits. This is important. Where to find more information American Academy of Dermatology: MarketingSheets.si National Eczema Association: nationaleczema.org The Society for Pediatric Dermatology: pedsderm.net Contact a health care provider if: You have severe itching, even with treatment. You scratch your skin regularly until it bleeds. Your rash looks different than usual. Your skin is painful, swollen, or more red than usual. You have a fever. Summary Eczema refers to a group of skin conditions that cause skin to become rough and inflamed. Each type has different triggers. Eczema of any type causes itching that may range from mild to severe. Treatment varies based on the type of eczema you have. Hydrocortisone steroid medicine or antihistamines can help with itching and inflammation. Protecting your skin is the best way to prevent eczema. Use creams or ointments to moisturize your skin. Avoid triggers and irritants. Treat flare-ups quickly. This information is not intended to replace advice given to you by your health care provider. Make sure you discuss any questions you have with your healthcare provider. Document Revised: 12/13/2019 Document Reviewed: 12/13/2019 Elsevier Patient Education  2022 [employer].

## 2020-09-01 ENCOUNTER — Other Ambulatory Visit: Payer: Self-pay

## 2020-09-04 ENCOUNTER — Other Ambulatory Visit: Payer: Self-pay

## 2020-09-05 ENCOUNTER — Other Ambulatory Visit: Payer: Medicaid Other

## 2020-09-05 ENCOUNTER — Other Ambulatory Visit: Payer: Self-pay

## 2020-09-05 DIAGNOSIS — Z79899 Other long term (current) drug therapy: Secondary | ICD-10-CM

## 2020-09-05 NOTE — Progress Notes (Signed)
Patient tolerated blood draw well today. 

## 2020-09-06 LAB — COMP. METABOLIC PANEL (12)
AST: 32 IU/L (ref 0–40)
Albumin/Globulin Ratio: 1.1 — ABNORMAL LOW (ref 1.2–2.2)
Albumin: 4.1 g/dL (ref 3.8–4.8)
Alkaline Phosphatase: 86 IU/L (ref 44–121)
BUN/Creatinine Ratio: 15 (ref 9–23)
BUN: 11 mg/dL (ref 6–20)
Bilirubin Total: 0.3 mg/dL (ref 0.0–1.2)
Calcium: 9.5 mg/dL (ref 8.7–10.2)
Chloride: 106 mmol/L (ref 96–106)
Creatinine, Ser: 0.72 mg/dL (ref 0.57–1.00)
Globulin, Total: 3.6 g/dL (ref 1.5–4.5)
Glucose: 96 mg/dL (ref 65–99)
Potassium: 4.5 mmol/L (ref 3.5–5.2)
Sodium: 140 mmol/L (ref 134–144)
Total Protein: 7.7 g/dL (ref 6.0–8.5)
eGFR: 114 mL/min/{1.73_m2} (ref >59)

## 2020-09-06 LAB — LIPID PANEL
Chol/HDL Ratio: 2.6 ratio (ref 0.0–4.4)
Cholesterol, Total: 134 mg/dL (ref 100–199)
HDL: 51 mg/dL (ref >39)
LDL Chol Calc (NIH): 65 mg/dL (ref 0–99)
Triglycerides: 95 mg/dL (ref 0–149)
VLDL Cholesterol Cal: 18 mg/dL (ref 5–40)

## 2020-09-06 LAB — CBC WITH DIFFERENTIAL/PLATELET
Basophils Absolute: 0 10*3/uL (ref 0.0–0.2)
Basos: 1 %
EOS (ABSOLUTE): 0.2 10*3/uL (ref 0.0–0.4)
Eos: 4 %
Hematocrit: 38.3 % (ref 34.0–46.6)
Hemoglobin: 12.5 g/dL (ref 11.1–15.9)
Immature Grans (Abs): 0 10*3/uL (ref 0.0–0.1)
Immature Granulocytes: 0 %
Lymphocytes Absolute: 3.1 10*3/uL (ref 0.7–3.1)
Lymphs: 50 %
MCH: 30.7 pg (ref 26.6–33.0)
MCHC: 32.6 g/dL (ref 31.5–35.7)
MCV: 94 fL (ref 79–97)
Monocytes Absolute: 0.5 10*3/uL (ref 0.1–0.9)
Monocytes: 8 %
Neutrophils Absolute: 2.3 10*3/uL (ref 1.4–7.0)
Neutrophils: 37 %
Platelets: 161 10*3/uL (ref 150–450)
RBC: 4.07 x10E6/uL (ref 3.77–5.28)
RDW: 12.7 % (ref 11.7–15.4)
WBC: 6.1 10*3/uL (ref 3.4–10.8)

## 2020-09-06 LAB — VITAMIN D 25 HYDROXY (VIT D DEFICIENCY, FRACTURES): Vit D, 25-Hydroxy: 36.5 ng/mL (ref 30.0–100.0)

## 2020-09-06 LAB — TSH: TSH: 2.06 u[IU]/mL (ref 0.450–4.500)

## 2020-09-11 ENCOUNTER — Ambulatory Visit: Payer: Medicaid Other

## 2020-09-14 ENCOUNTER — Telehealth: Payer: Self-pay | Admitting: *Deleted

## 2020-09-14 NOTE — Telephone Encounter (Signed)
-----   Message from Roney Jaffe, [place] sent at 09/07/2020 12:14 PM EDT ----- Please call patient and let her know that her thyroid function, kidney function and liver function are within normal limits.  She does not show signs of anemia.

## 2020-09-14 NOTE — Telephone Encounter (Signed)
Patient verified DOB Patient viewed results via mychart and picked up OTC vitamin d to help level remain stable.

## 2020-09-19 ENCOUNTER — Telehealth: Payer: Self-pay

## 2020-09-19 NOTE — Telephone Encounter (Signed)
Telephoned patient at mobile number. Left voice message leaving BCCCP information.

## 2020-09-22 ENCOUNTER — Ambulatory Visit (INDEPENDENT_AMBULATORY_CARE_PROVIDER_SITE_OTHER): Payer: No Payment, Other | Admitting: Physician Assistant

## 2020-09-22 ENCOUNTER — Other Ambulatory Visit: Payer: Self-pay

## 2020-09-22 DIAGNOSIS — F5105 Insomnia due to other mental disorder: Secondary | ICD-10-CM

## 2020-09-22 DIAGNOSIS — F3162 Bipolar disorder, current episode mixed, moderate: Secondary | ICD-10-CM | POA: Diagnosis not present

## 2020-09-22 DIAGNOSIS — F411 Generalized anxiety disorder: Secondary | ICD-10-CM

## 2020-09-22 DIAGNOSIS — F99 Mental disorder, not otherwise specified: Secondary | ICD-10-CM | POA: Diagnosis not present

## 2020-09-22 MED ORDER — ARIPIPRAZOLE 15 MG PO TABS
15.0000 mg | ORAL_TABLET | Freq: Every day | ORAL | 1 refills | Status: DC
  Filled 2020-09-22: qty 30, 30d supply, fill #0

## 2020-09-22 MED ORDER — TRAZODONE HCL 100 MG PO TABS
100.0000 mg | ORAL_TABLET | Freq: Every day | ORAL | 1 refills | Status: DC
  Filled 2020-09-22: qty 30, 30d supply, fill #0

## 2020-09-22 MED ORDER — FLUOXETINE HCL 40 MG PO CAPS
40.0000 mg | ORAL_CAPSULE | Freq: Every day | ORAL | 1 refills | Status: DC
  Filled 2020-09-22: qty 30, 30d supply, fill #0

## 2020-09-22 MED ORDER — HYDROXYZINE HCL 25 MG PO TABS
25.0000 mg | ORAL_TABLET | Freq: Three times a day (TID) | ORAL | 1 refills | Status: DC | PRN
  Filled 2020-09-22: qty 75, 25d supply, fill #0
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
## 2020-09-24 ENCOUNTER — Encounter (HOSPITAL_COMMUNITY): Payer: Self-pay | Admitting: Physician Assistant

## 2020-09-29 ENCOUNTER — Other Ambulatory Visit: Payer: Self-pay

## 2020-10-04 ENCOUNTER — Other Ambulatory Visit: Payer: Self-pay

## 2020-10-04 ENCOUNTER — Ambulatory Visit (INDEPENDENT_AMBULATORY_CARE_PROVIDER_SITE_OTHER): Payer: Self-pay | Admitting: Obstetrics and Gynecology

## 2020-10-04 DIAGNOSIS — Z3202 Encounter for pregnancy test, result negative: Secondary | ICD-10-CM

## 2020-10-04 DIAGNOSIS — R87810 Cervical high risk human papillomavirus (HPV) DNA test positive: Secondary | ICD-10-CM

## 2020-10-04 LAB — POCT PREGNANCY, URINE: Preg Test, Ur: NEGATIVE

## 2020-10-04 NOTE — Progress Notes (Signed)
Patient given informed consent, signed copy in the chart, time out was performed. 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  UPT neg. Placed in lithotomy position. Cervix viewed with speculum and colposcope after application of acetic acid.   Colposcopy adequate?  yes Acetowhite lesions? No, no nonstaining with lugol's areas Punctation?no Mosaicism?  no Abnormal vasculature?  no Biopsies?no ECC?no  COMMENTS: Patient was given post procedure instructions.  Suggest repap in 1 year with HPV testing.  Warden Fillers, MD

## 2020-10-22 ENCOUNTER — Encounter (HOSPITAL_COMMUNITY): Payer: Self-pay | Admitting: Emergency Medicine

## 2020-10-22 ENCOUNTER — Other Ambulatory Visit: Payer: Self-pay

## 2020-10-22 ENCOUNTER — Emergency Department (HOSPITAL_COMMUNITY)
Admission: EM | Admit: 2020-10-22 | Discharge: 2020-10-23 | Disposition: A | Discharge status: Home / Self Care | Payer: Medicaid Other | Attending: Emergency Medicine | Admitting: Emergency Medicine

## 2020-10-22 DIAGNOSIS — Z5321 Procedure and treatment not carried out due to patient leaving prior to being seen by health care provider: Secondary | ICD-10-CM | POA: Insufficient documentation

## 2020-10-22 DIAGNOSIS — Y92410 Unspecified street and highway as the place of occurrence of the external cause: Secondary | ICD-10-CM | POA: Insufficient documentation

## 2020-10-22 DIAGNOSIS — R300 Dysuria: Secondary | ICD-10-CM | POA: Insufficient documentation

## 2020-10-22 DIAGNOSIS — M542 Cervicalgia: Secondary | ICD-10-CM | POA: Insufficient documentation

## 2020-10-22 DIAGNOSIS — M549 Dorsalgia, unspecified: Secondary | ICD-10-CM | POA: Insufficient documentation

## 2020-10-22 DIAGNOSIS — S61214A Laceration without foreign body of right ring finger without damage to nail, initial encounter: Secondary | ICD-10-CM | POA: Insufficient documentation
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
## 2020-10-22 NOTE — ED Notes (Signed)
Pt also requesting STD testing. 

## 2020-10-23 ENCOUNTER — Encounter (HOSPITAL_BASED_OUTPATIENT_CLINIC_OR_DEPARTMENT_OTHER): Payer: Self-pay | Admitting: Emergency Medicine

## 2020-10-23 ENCOUNTER — Emergency Department (HOSPITAL_BASED_OUTPATIENT_CLINIC_OR_DEPARTMENT_OTHER)
Admission: EM | Admit: 2020-10-23 | Discharge: 2020-10-23 | Disposition: A | Discharge status: Home / Self Care | Payer: Medicaid Other | Attending: Emergency Medicine | Admitting: Emergency Medicine

## 2020-10-23 ENCOUNTER — Other Ambulatory Visit: Payer: Self-pay

## 2020-10-23 DIAGNOSIS — J45909 Unspecified asthma, uncomplicated: Secondary | ICD-10-CM | POA: Insufficient documentation

## 2020-10-23 DIAGNOSIS — S61214A Laceration without foreign body of right ring finger without damage to nail, initial encounter: Secondary | ICD-10-CM | POA: Insufficient documentation

## 2020-10-23 DIAGNOSIS — N390 Urinary tract infection, site not specified: Secondary | ICD-10-CM | POA: Insufficient documentation

## 2020-10-23 DIAGNOSIS — W268XXA Contact with other sharp object(s), not elsewhere classified, initial encounter: Secondary | ICD-10-CM | POA: Insufficient documentation

## 2020-10-23 DIAGNOSIS — F17210 Nicotine dependence, cigarettes, uncomplicated: Secondary | ICD-10-CM | POA: Insufficient documentation

## 2020-10-23 DIAGNOSIS — R319 Hematuria, unspecified: Secondary | ICD-10-CM

## 2020-10-23 LAB — URINALYSIS, ROUTINE W REFLEX MICROSCOPIC
Bilirubin Urine: NEGATIVE
Bilirubin Urine: NEGATIVE
Glucose, UA: NEGATIVE mg/dL
Glucose, UA: NEGATIVE mg/dL
Ketones, ur: NEGATIVE mg/dL
Ketones, ur: NEGATIVE mg/dL
Nitrite: POSITIVE — AB
Nitrite: POSITIVE — AB
Protein, ur: 100 mg/dL — AB
Protein, ur: 100 mg/dL — AB
Specific Gravity, Urine: 1.026 (ref 1.005–1.030)
Specific Gravity, Urine: 1.03 (ref 1.005–1.030)
Trans Epithel, UA: 2
WBC, UA: >50 WBC/hpf — ABNORMAL HIGH (ref 0–5)
WBC, UA: >50 WBC/hpf — ABNORMAL HIGH (ref 0–5)
pH: 6 (ref 5.0–8.0)
pH: 6 (ref 5.0–8.0)

## 2020-10-23 LAB — GC/CHLAMYDIA PROBE AMP (~~LOC~~) NOT AT ARMC
Chlamydia: NEGATIVE
Comment: NEGATIVE
Comment: NORMAL
Neisseria Gonorrhea: NEGATIVE

## 2020-10-23 MED ORDER — PHENAZOPYRIDINE HCL 100 MG PO TABS
200.0000 mg | ORAL_TABLET | Freq: Once | ORAL | Status: AC
  Administered 2020-10-23: 200 mg via ORAL
  Filled 2020-10-23: qty 2

## 2020-10-23 MED ORDER — CEPHALEXIN 500 MG PO CAPS
500.0000 mg | ORAL_CAPSULE | Freq: Four times a day (QID) | ORAL | 0 refills | Status: DC
  Filled 2020-10-23: qty 28, 7d supply, fill #0

## 2020-10-23 MED ORDER — PHENAZOPYRIDINE HCL 200 MG PO TABS
200.0000 mg | ORAL_TABLET | Freq: Three times a day (TID) | ORAL | 0 refills | Status: AC
  Filled 2020-10-23: qty 6, 2d supply, fill #0

## 2020-10-23 MED ORDER — CEPHALEXIN 250 MG PO CAPS
500.0000 mg | ORAL_CAPSULE | Freq: Once | ORAL | Status: AC
  Administered 2020-10-23: 500 mg via ORAL
  Filled 2020-10-23: qty 2
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
## 2020-10-23 NOTE — ED Notes (Signed)
Pt called x3 times for vitals at noted times, pt not visible or present in ED lobby, Triage RN notified.

## 2020-10-23 NOTE — ED Notes (Signed)
Laboratory Studies available from previous ED Visit yesterday are available for Review and Analysis.

## 2020-10-23 NOTE — ED Provider Notes (Signed)
[facility] EMERGENCY DEPT Provider Note   CSN: [id number] Arrival date & time: 10/23/20  2032     History Chief Complaint  Patient presents with   Back Pain   Dysuria   Laceration    Christina David is a 30 y.o. female.  She is here with a complaint of low back pain and dysuria that started 4 to 5 days ago.  Worsened with urination.  She is on her menstrual period right now.  She is also had a new sexual contacts would like to get testing for STDs.  She denies any vaginal discharge but states she is on her period.  No fevers or chills.  She is also complaining of a wound to her right ring finger that occurred yesterday.  She would like it evaluated to see if it is infected.  She also wants the excess skin to be cut off.  The history is provided by the patient.  Dysuria Pain quality:  Burning Pain severity:  Severe Onset quality:  Gradual Duration:  5 days Timing:  Intermittent Progression:  Unchanged Chronicity:  New Recent urinary tract infections: no   Relieved by:  Nothing Worsened by:  Nothing Ineffective treatments:  None tried Urinary symptoms: frequent urination   Associated symptoms: no abdominal pain, no fever, no nausea and no vomiting   Risk factors: not pregnant       Past Medical History:  Diagnosis Date   Asthma    Depression    Mania (HCC)    Mental disorder    Never had Md diagnosis    Patient Active Problem List   Diagnosis Date Noted   Pap smear of cervix shows high risk HPV present 10/04/2020   Eczema 08/31/2020   Long term current use of antipsychotic medication 08/31/2020   Insomnia due to other mental disorder 03/14/2020   Bipolar disorder, current episode mixed, moderate (HCC) 02/08/2020   Episode of recurrent major depressive disorder (HCC) 02/08/2020   Anxiety state 02/08/2020   Cocaine abuse with cocaine-induced mood disorder (HCC) 08/21/2017    Past Surgical History:  Procedure Laterality Date   TONSILECTOMY,  ADENOIDECTOMY, BILATERAL MYRINGOTOMY AND TUBES     UMBILICAL HERNIA REPAIR       OB History     Gravida  0   Para      Term      Preterm      AB      Living         SAB      IAB      Ectopic      Multiple      Live Births              Family History  Problem Relation Age of Onset   Hypertension Mother     Social History   Tobacco Use   Smoking status: Every Day    Packs/day: 0.25    Types: Cigarettes   Smokeless tobacco: Never  Substance Use Topics   Alcohol use: No   Drug use: Yes    Types: Marijuana    Comment: states does not smoke it anymore    Home Medications Prior to Admission medications   Medication Sig Start Date End Date Taking? Authorizing Provider  ARIPiprazole (ABILIFY) 15 MG tablet Take 1 tablet (15 mg total) by mouth daily. 09/22/20 09/22/21  Nwoko, Tommas Olp, PA  cetirizine (ZYRTEC ALLERGY) 10 MG tablet Take 1 tablet (10 mg total) by mouth daily. 08/31/20  Mayers, Cari S, PA-C  FLUoxetine (PROZAC) 40 MG capsule Take 1 capsule (40 mg total) by mouth daily. 09/22/20   Nwoko, Tommas Olp, PA  hydrOXYzine (ATARAX/VISTARIL) 25 MG tablet Take 1 tablet (25 mg total) by mouth 3 (three) times daily as needed for anxiety. 09/22/20   Nwoko, Tommas Olp, PA  traZODone (DESYREL) 100 MG tablet Take 1 tablet (100 mg total) by mouth at bedtime. 09/22/20 09/22/21  Nwoko, Tommas Olp, PA  triamcinolone cream (KENALOG) 0.1 % Apply 1 application topically 2 (two) times daily. 08/31/20   Mayers, Cari S, PA-C    Allergies    Lamictal [lamotrigine], Bee venom, and Trileptal [oxcarbazepine]  Review of Systems   Review of Systems  Constitutional:  Negative for fever.  HENT:  Negative for sore throat.   Eyes:  Negative for visual disturbance.  Respiratory:  Negative for shortness of breath.   Cardiovascular:  Negative for chest pain.  Gastrointestinal:  Negative for abdominal pain, nausea and vomiting.  Genitourinary:  Positive for dysuria.  Musculoskeletal:  Positive  for back pain.  Skin:  Positive for wound. Negative for rash.  Neurological:  Negative for headaches.   Physical Exam Updated Vital Signs BP 132/87 (BP Location: Left Arm)   Pulse 85   Temp 98.5 F (36.9 C) (Oral)   Resp 18   Ht 5\' 6"  (1.676 m)   Wt 97.1 kg   LMP 10/23/2020   SpO2 100%   BMI 34.54 kg/m   Physical Exam Vitals and nursing note reviewed.  Constitutional:      General: She is not in acute distress.    Appearance: Normal appearance. She is well-developed.  HENT:     Head: Normocephalic and atraumatic.  Eyes:     Conjunctiva/sclera: Conjunctivae normal.  Cardiovascular:     Rate and Rhythm: Normal rate and regular rhythm.     Heart sounds: No murmur heard. Pulmonary:     Effort: Pulmonary effort is normal. No respiratory distress.     Breath sounds: Normal breath sounds.  Abdominal:     Palpations: Abdomen is soft.     Tenderness: There is no abdominal tenderness. There is no right CVA tenderness, left CVA tenderness, guarding or rebound.  Musculoskeletal:        General: Tenderness present. Normal range of motion.     Cervical back: Neck supple.     Comments: She is small laceration to her right ring finger with a flap of skin.  There is no active bleeding no surrounding erythema no drainage.  Skin:    General: Skin is warm and dry.  Neurological:     General: No focal deficit present.     Mental Status: She is alert.    ED Results / Procedures / Treatments   Labs (all labs ordered are listed, but only abnormal results are displayed) Labs Reviewed  URINALYSIS, ROUTINE W REFLEX MICROSCOPIC - Abnormal; Notable for the following components:      Result Value   APPearance HAZY (*)    Hgb urine dipstick LARGE (*)    Protein, ur 100 (*)    Nitrite POSITIVE (*)    Leukocytes,Ua LARGE (*)    WBC, UA >50 (*)    Bacteria, UA MANY (*)    All other components within normal limits    EKG None  Radiology No results found.  Procedures Procedures    Medications Ordered in ED Medications - No data to display  ED Course  I have reviewed the triage  vital signs and the nursing notes.  Pertinent labs & imaging results that were available during my care of the patient were reviewed by me and considered in my medical decision making (see chart for details).  Clinical Course as of 10/24/20 1021  Mon Oct 23, 2020  2145 She actually had urine urine culture and GC chlamydia done last evening at [facility].  Urinalysis looks grossly infected with greater than 50 whites and nitrite positive.  Urine culture pending.  GC and Chlamydia negative. [MB]  2151 I reviewed the work-up that she received at [facility] with her and she said she would rather not do the HIV or RPR testing as she can get this done at the health clinic.  She is comfortable plan for antibiotics Pyridium and outpatient follow-up. [MB]    Clinical Course User Index [MB] Terrilee Files, MD   MDM Rules/Calculators/A&P                          30 year old female here with urinary tract infection symptoms along with concerns for STD and evaluation of right ring finger laceration greater than 24 hours old.  She had some work-up done at [facility] last evening that I was able to review.  The small avulsion of skin was cut off her finger by the ED tech.  Wound was dressed.  Patient is declining further STD testing at this time.  Will cover with antibiotics for UTI.  Return instructions discussed  Final Clinical Impression(s) / ED Diagnoses Final diagnoses:  Urinary tract infection with hematuria, site unspecified    Rx / DC Orders ED Discharge Orders          Ordered    cephALEXin (KEFLEX) 500 MG capsule  4 times daily        10/23/20 2153    phenazopyridine (PYRIDIUM) 200 MG tablet  3 times daily        10/23/20 2153             Terrilee Files, MD 10/24/20 1022

## 2020-10-23 NOTE — Discharge Instructions (Addendum)
You were seen in the emergency department for evaluation of burning when you urinate and low back pain.  You had a urinalysis done that showed signs of infection.  Your gonorrhea and Chlamydia test were negative.  We are prescribing you antibiotics and medication to help with the burning pain.  Please drink plenty of fluids.  Follow-up with your doctor or the health department for further evaluation.

## 2020-10-23 NOTE — ED Notes (Signed)
Pt refused blood draw. Stated she could get it done through community health clinic.

## 2020-10-24 ENCOUNTER — Other Ambulatory Visit: Payer: Self-pay

## 2020-10-25 ENCOUNTER — Other Ambulatory Visit: Payer: Self-pay

## 2020-10-25 LAB — URINE CULTURE: Culture: 60000 — AB

## 2020-10-26 ENCOUNTER — Telehealth: Payer: Self-pay

## 2020-10-26 NOTE — Telephone Encounter (Signed)
Post ED Visit - Positive Culture Follow-up  Culture report reviewed by antimicrobial stewardship pharmacist: [facility] []  , Pharm.D. []  Enzo Bi, Pharm.D., BCPS AQ-ID []  , Pharm.D., BCPS []  Celedonio Miyamoto, Pharm.D., BCPS []  [place], Garvin Fila.D., BCPS, AAHIVP []  , Pharm.D., BCPS, AAHIVP []  Georgina Pillion, PharmD, BCPS []  , PharmD, BCPS []  [place], PharmD, BCPS []  [address], PharmD []  , PharmD, BCPS []  Estella Husk, PharmD  Pharmacy Team [x]  Lysle Pearl, PharmD []  , PharmD []  Phillips Climes, PharmD []  , Rph []  Agapito Games) , PharmD []  Verlan Friends, PharmD []  , PharmD []  Mervyn Gay, PharmD []  , PharmD []  Vinnie Level, PharmD []  [facility], PharmD []  , PharmD []  Ivery Quale, PharmD   Positive urine culture Treated with Keflex and Pyridum, organism sensitive to the same and no further patient follow-up is required at this time.  10/26/2020, 9:18 AM

## 2020-11-01 ENCOUNTER — Other Ambulatory Visit: Payer: Self-pay

## 2020-11-01 ENCOUNTER — Encounter: Payer: Self-pay | Admitting: Nurse Practitioner

## 2020-11-01 ENCOUNTER — Ambulatory Visit: Payer: Self-pay | Attending: Nurse Practitioner | Admitting: Nurse Practitioner

## 2020-11-01 DIAGNOSIS — J4520 Mild intermittent asthma, uncomplicated: Secondary | ICD-10-CM

## 2020-11-01 DIAGNOSIS — J302 Other seasonal allergic rhinitis: Secondary | ICD-10-CM

## 2020-11-01 DIAGNOSIS — Z7689 Persons encountering health services in other specified circumstances: Secondary | ICD-10-CM

## 2020-11-01 DIAGNOSIS — R030 Elevated blood-pressure reading, without diagnosis of hypertension: Secondary | ICD-10-CM

## 2020-11-01 MED ORDER — FLUTICASONE PROPIONATE 50 MCG/ACT NA SUSP
2.0000 | Freq: Every day | NASAL | 6 refills | Status: AC
  Filled 2020-11-01: qty 16, 30d supply, fill #0
  Filled 2020-11-21: qty 16, 25d supply, fill #0

## 2020-11-01 MED ORDER — ALBUTEROL SULFATE HFA 108 (90 BASE) MCG/ACT IN AERS
2.0000 | INHALATION_SPRAY | Freq: Four times a day (QID) | RESPIRATORY_TRACT | 1 refills | Status: AC | PRN
  Filled 2020-11-01 – 2020-11-21 (×2): qty 18, 25d supply, fill #0

## 2020-11-01 NOTE — Progress Notes (Signed)
Virtual Visit via Telephone Note Due to national recommendations of social distancing due to COVID 19, telehealth visit is felt to be most appropriate for this patient at this time.  I discussed the limitations, risks, security and privacy concerns of performing an evaluation and management service by telephone and the availability of in person appointments. I also discussed with the patient that there may be a patient responsible charge related to this service. The patient expressed understanding and agreed to proceed.    I connected with Christina David on 11/01/20  at  10:30 AM EDT  EDT by telephone and verified that I am speaking with the correct person using two identifiers.  Location of Patient: Private Residence   [facility] of Provider: [facility]    Persons participating in Telemedicine visit: Bertram Denver FNP-BC Brittinee O Bora    History of Present Illness: Telemedicine visit for: Establish Care  Patient has been counseled on age-appropriate routine health concerns for screening and prevention. These are reviewed and up-to-date. Referrals have been placed accordingly. Immunizations are up-to-date or declined.     PAP SMEAR: July 2022 Abnormal: colposcopy results benign. Per patient this was performed at the health department   Psych Currently being followed by Behavioral health for bipolar disorder (mixed/moderate), anxiety and insomnia.    HTN States she was diagnosed with high blood pressure however she is not currently taking an antihypertensive. She does not monitor her blood pressure at home. Denies chest pain, shortness of breath, palpitations, lightheadedness, dizziness, headaches or BLE edema.   BP Readings from Last 3 Encounters:  10/23/20 132/87  10/22/20 118/88  10/04/20 (!) 129/94     Asthma/Allergy She has a diagnosed with asthma. Currently out of advair and albuterol inhaler. Denies worsening cough, wheezing or  shortness of breath. She does endorse increased nasal congestion.     Past Medical History:  Diagnosis Date   Asthma    Depression    Mania (HCC)    Mental disorder    Never had Md diagnosis    Past Surgical History:  Procedure Laterality Date   TONSILECTOMY, ADENOIDECTOMY, BILATERAL MYRINGOTOMY AND TUBES     UMBILICAL HERNIA REPAIR      Family History  Problem Relation Age of Onset   Hypertension Mother     Social History   Socioeconomic History   Marital status: Single    Spouse name: Not on file   Number of children: Not on file   Years of education: Not on file   Highest education level: Not on file  Occupational History   Not on file  Tobacco Use   Smoking status: Every Day    Packs/day: 0.25    Types: Cigarettes   Smokeless tobacco: Never  Substance and Sexual Activity   Alcohol use: No   Drug use: Yes    Types: Marijuana    Comment: states does not smoke it anymore   Sexual activity: Yes    Birth control/protection: None  Other Topics Concern   Not on file  Social History Narrative   Not on file   Social Determinants of Health   Financial Resource Strain: Not on file  Food Insecurity: Not on file  Transportation Needs: Not on file  Physical Activity: Not on file  Stress: Not on file  Social Connections: Not on file     Observations/Objective: Awake, alert and oriented x 3   Review of Systems  Constitutional:  Negative for fever, malaise/fatigue and weight loss.  HENT:  Positive for congestion. Negative for nosebleeds.   Eyes: Negative.  Negative for blurred vision, double vision and photophobia.  Respiratory: Negative.  Negative for cough and shortness of breath.   Cardiovascular: Negative.  Negative for chest pain, palpitations and leg swelling.  Gastrointestinal: Negative.  Negative for heartburn, nausea and vomiting.  Musculoskeletal: Negative.  Negative for myalgias.  Neurological: Negative.  Negative for dizziness, focal weakness,  seizures and headaches.  Psychiatric/Behavioral:  Positive for depression. Negative for suicidal ideas. The patient is nervous/anxious.    Assessment and Plan: Diagnoses and all orders for this visit:  Encounter to establish care  Elevated blood pressure reading F/U In office blood pressure. If elevated will start HCTZ 25mg   Mild intermittent asthma without complication -     albuterol (VENTOLIN HFA) 108 (90 Base) MCG/ACT inhaler; Inhale 2 puffs into the lungs every 6 (six) hours as needed for wheezing or shortness of breath.  Seasonal allergies -     fluticasone (FLONASE) 50 MCG/ACT nasal spray; Place 2 sprays into both nostrils daily.    Follow Up Instructions Return in about 2 months (around 01/01/2021) for BP CHECK.     I discussed the assessment and treatment plan with the patient. The patient was provided an opportunity to ask questions and all were answered. The patient agreed with the plan and demonstrated an understanding of the instructions.   The patient was advised to call back or seek an in-person evaluation if the symptoms worsen or if the condition fails to improve as anticipated.  I provided 14 minutes of non-face-to-face time during this encounter including median intraservice time, reviewing previous notes, labs, imaging, medications and explaining diagnosis and management.  01/03/2021, FNP-BC

## 2020-11-08 ENCOUNTER — Other Ambulatory Visit: Payer: Self-pay

## 2020-11-17 ENCOUNTER — Ambulatory Visit (INDEPENDENT_AMBULATORY_CARE_PROVIDER_SITE_OTHER): Payer: No Payment, Other | Admitting: Physician Assistant

## 2020-11-17 ENCOUNTER — Encounter (HOSPITAL_COMMUNITY): Payer: Self-pay | Admitting: Physician Assistant

## 2020-11-17 ENCOUNTER — Other Ambulatory Visit: Payer: Self-pay

## 2020-11-17 DIAGNOSIS — F411 Generalized anxiety disorder: Secondary | ICD-10-CM | POA: Diagnosis not present

## 2020-11-17 DIAGNOSIS — F99 Mental disorder, not otherwise specified: Secondary | ICD-10-CM

## 2020-11-17 DIAGNOSIS — F3162 Bipolar disorder, current episode mixed, moderate: Secondary | ICD-10-CM | POA: Diagnosis not present

## 2020-11-17 DIAGNOSIS — F5105 Insomnia due to other mental disorder: Secondary | ICD-10-CM | POA: Diagnosis not present

## 2020-11-17 MED ORDER — ARIPIPRAZOLE 20 MG PO TABS
20.0000 mg | ORAL_TABLET | Freq: Every day | ORAL | 1 refills | Status: AC
  Filled 2020-11-17: qty 30, 30d supply, fill #0
  Filled 2021-01-06 – 2021-02-09 (×2): qty 30, 30d supply, fill #1
  Filled 2021-05-25: qty 30, 30d supply, fill #0

## 2020-11-17 MED ORDER — TRAZODONE HCL 100 MG PO TABS
100.0000 mg | ORAL_TABLET | Freq: Every day | ORAL | 1 refills | Status: AC
  Filled 2020-11-17: qty 30, 30d supply, fill #0
  Filled 2021-01-06 – 2021-02-22 (×3): qty 30, 30d supply, fill #1
  Filled 2021-05-25 – 2021-07-17 (×3): qty 30, 30d supply, fill #0

## 2020-11-17 MED ORDER — FLUOXETINE HCL 20 MG PO CAPS
60.0000 mg | ORAL_CAPSULE | Freq: Every day | ORAL | 1 refills | Status: AC
  Filled 2020-11-17: qty 90, 30d supply, fill #0
  Filled 2021-01-06 – 2021-02-22 (×3): qty 90, 30d supply, fill #1
  Filled 2021-05-25: qty 90, 30d supply, fill #0

## 2020-11-17 MED ORDER — HYDROXYZINE HCL 50 MG PO TABS
50.0000 mg | ORAL_TABLET | Freq: Four times a day (QID) | ORAL | 1 refills | Status: DC | PRN
  Filled 2020-11-17: qty 100, 25d supply, fill #0
  Filled 2021-01-06 – 2021-02-22 (×3): qty 100, 25d supply, fill #1

## 2020-11-17 NOTE — Progress Notes (Signed)
BH MD/PA/NP OP Progress Note  11/17/2020 10:53 PM Christina David  MRN:  [id number]  Chief Complaint:  Chief Complaint   Medication Management    HPI:     Visit Diagnosis:    ICD-10-CM   1. Bipolar disorder, current episode mixed, moderate (HCC)  F31.62 ARIPiprazole (ABILIFY) 20 MG tablet    FLUoxetine (PROZAC) 20 MG capsule    2. Anxiety state  F41.1 hydrOXYzine (ATARAX/VISTARIL) 50 MG tablet    3. Insomnia due to other mental disorder  F51.05 traZODone (DESYREL) 100 MG tablet   F99       Past Psychiatric History:  Bipolar disorder Generalized anxiety disorder Insomnia  Past Medical History:  Past Medical History:  Diagnosis Date   Asthma    Depression    Mania (HCC)    Mental disorder    Never had Md diagnosis    Past Surgical History:  Procedure Laterality Date   TONSILECTOMY, ADENOIDECTOMY, BILATERAL MYRINGOTOMY AND TUBES     UMBILICAL HERNIA REPAIR      Family Psychiatric History:  Mother struggled with drug addiction  Family History:  Family History  Problem Relation Age of Onset   Hypertension Mother     Social History:  Social History   Socioeconomic History   Marital status: Single    Spouse name: Not on file   Number of children: Not on file   Years of education: Not on file   Highest education level: Not on file  Occupational History   Not on file  Tobacco Use   Smoking status: Every Day    Packs/day: 0.25    Types: Cigarettes   Smokeless tobacco: Never  Substance and Sexual Activity   Alcohol use: No   Drug use: Yes    Types: Marijuana    Comment: states does not smoke it anymore   Sexual activity: Yes    Birth control/protection: None  Other Topics Concern   Not on file  Social History Narrative   Not on file   Social Determinants of Health   Financial Resource Strain: Not on file  Food Insecurity: Not on file  Transportation Needs: Not on file  Physical Activity: Not on file  Stress: Not on file  Social  Connections: Not on file    Allergies:  Allergies  Allergen Reactions   Lamictal [Lamotrigine]    Bee Venom Swelling   Trileptal [Oxcarbazepine] Hives, Swelling and Other (See Comments)    Headaches     Metabolic Disorder Labs: No results found for: HGBA1C, MPG No results found for: PROLACTIN Lab Results  Component Value Date   CHOL 134 09/05/2020   TRIG 95 09/05/2020   HDL 51 09/05/2020   CHOLHDL 2.6 09/05/2020   LDLCALC 65 09/05/2020   Lab Results  Component Value Date   TSH 2.060 09/05/2020    Therapeutic Level Labs: No results found for: LITHIUM No results found for: VALPROATE No components found for:  CBMZ  Current Medications: Current Outpatient Medications  Medication Sig Dispense Refill   FLUoxetine (PROZAC) 20 MG capsule Take 3 capsules (60 mg total) by mouth daily. 90 capsule 1   albuterol (VENTOLIN HFA) 108 (90 Base) MCG/ACT inhaler Inhale 2 puffs into the lungs every 6 (six) hours as needed for wheezing or shortness of breath. 18 g 1   ARIPiprazole (ABILIFY) 20 MG tablet Take 1 tablet (20 mg total) by mouth daily. 30 tablet 1   cephALEXin (KEFLEX) 500 MG capsule Take 1 capsule (500 mg total) by  mouth 4 (four) times daily. 28 capsule 0   cetirizine (ZYRTEC ALLERGY) 10 MG tablet Take 1 tablet (10 mg total) by mouth daily. 30 tablet 11   fluticasone (FLONASE) 50 MCG/ACT nasal spray Place 2 sprays into both nostrils daily. 16 g 6   hydrOXYzine (ATARAX/VISTARIL) 50 MG tablet Take 1 tablet (50 mg total) by mouth every 6 (six) hours as needed for anxiety. 100 tablet 1   phenazopyridine (PYRIDIUM) 200 MG tablet Take 1 tablet (200 mg total) by mouth 3 (three) times daily. 6 tablet 0   traZODone (DESYREL) 100 MG tablet Take 1 tablet (100 mg total) by mouth at bedtime. 30 tablet 1   triamcinolone cream (KENALOG) 0.1 % Apply 1 application topically 2 (two) times daily. 454 g 0   No current facility-administered medications for this visit.      Musculoskeletal: Strength & Muscle Tone: within normal limits Gait & Station: normal Patient leans: N/A  Psychiatric Specialty Exam: Review of Systems  Psychiatric/Behavioral:  Positive for decreased concentration and sleep disturbance. Negative for dysphoric mood, hallucinations, self-injury and suicidal ideas. The patient is nervous/anxious and is hyperactive.    Blood pressure 129/88, pulse 87, height 5\' 6"  (1.676 m), weight 204 lb (92.5 kg), last menstrual period 10/23/2020, SpO2 100 %.Body mass index is 32.93 kg/m.  General Appearance: Well Groomed  Eye Contact:  Good  Speech:  Clear and Coherent and Normal Rate  Volume:  Normal  Mood:  Anxious and Depressed  Affect:  Congruent and Depressed  Thought Process:  Coherent, Goal Directed, and Descriptions of Associations: Intact  Orientation:  Full (Time, Place, and Person)  Thought Content: WDL   Suicidal Thoughts:  No  Homicidal Thoughts:  No  Memory:  Immediate;   Good Recent;   Good Remote;   Good  Judgement:  Good  Insight:  Good  Psychomotor Activity:  Normal  Concentration:  Concentration: Good and Attention Span: Good  Recall:  Good  Fund of Knowledge: Fair  Language: Good  Akathisia:  NA  Handed:  Right  AIMS (if indicated): not done  Assets:  Communication Skills Desire for Improvement Housing Social Support  ADL's:  Intact  Cognition: WNL  Sleep:  Fair   Screenings: GAD-7    12/23/2020 Visit from 11/17/2020 in [facility] Office Visit from 09/22/2020 in [facility] Office Visit from 08/31/2020 in [facility] Office Visit from 08/11/2020 in [facility] Counselor from 02/04/2020 in [facility]  Total GAD-7 Score 16 17 14 20 14       PHQ2-9    Flowsheet Row Office Visit from 11/17/2020 in [facility] Office Visit from 09/22/2020 in [facility] Office Visit from 08/31/2020 in [place] [facility] Office Visit from 08/11/2020 in [facility] Counselor from 02/04/2020 in [facility]  PHQ-2 Total Score 4 4 0 5 5  PHQ-9 Total Score 18 19 -- 21 13      Flowsheet Row Office Visit from 11/17/2020 in [facility] ED from 10/23/2020 in [facility] Office Visit from 09/22/2020 in [facility]  C-SSRS RISK CATEGORY Error: Q7 should not be populated when Q6 is No No Risk Moderate Risk        Assessment and Plan:     1. Bipolar disorder, current episode mixed, moderate (HCC)  -  ARIPiprazole (ABILIFY) 20 MG tablet; Take 1 tablet (20 mg total) by mouth daily.  Dispense: 30 tablet; Refill: 1 - FLUoxetine (PROZAC) 20 MG capsule; Take 3 capsules (60 mg total) by mouth daily.  Dispense: 90 capsule; Refill: 1  2. Anxiety state  - hydrOXYzine (ATARAX/VISTARIL) 50 MG tablet; Take 1 tablet (50 mg total) by mouth every 6 (six) hours as needed for anxiety.  Dispense: 100 tablet; Refill: 1  3. Insomnia due to other mental disorder  - traZODone (DESYREL) 100 MG tablet; Take 1 tablet (100 mg total) by mouth at bedtime.  Dispense: 30 tablet; Refill: 1  Patient to follow up in 7 weeks Provider spent a total of 20 minutes with the patient/reviewing the patient's chart  Meta Hatchet, PA 11/17/2020, 10:53 PM

## 2020-11-21 ENCOUNTER — Other Ambulatory Visit: Payer: Self-pay

## 2020-11-21 ENCOUNTER — Other Ambulatory Visit: Payer: Self-pay | Admitting: Physician Assistant

## 2020-11-21 DIAGNOSIS — L309 Dermatitis, unspecified: Secondary | ICD-10-CM

## 2020-11-22 ENCOUNTER — Other Ambulatory Visit: Payer: Self-pay

## 2020-11-29 ENCOUNTER — Other Ambulatory Visit: Payer: Self-pay

## 2020-12-13 ENCOUNTER — Encounter (HOSPITAL_BASED_OUTPATIENT_CLINIC_OR_DEPARTMENT_OTHER): Payer: Self-pay | Admitting: Emergency Medicine

## 2020-12-13 ENCOUNTER — Emergency Department (HOSPITAL_BASED_OUTPATIENT_CLINIC_OR_DEPARTMENT_OTHER)
Admission: EM | Admit: 2020-12-13 | Discharge: 2020-12-13 | Disposition: A | Discharge status: Home / Self Care | Payer: Self-pay | Attending: Emergency Medicine | Admitting: Emergency Medicine

## 2020-12-13 ENCOUNTER — Other Ambulatory Visit: Payer: Self-pay

## 2020-12-13 ENCOUNTER — Emergency Department (HOSPITAL_BASED_OUTPATIENT_CLINIC_OR_DEPARTMENT_OTHER): Payer: Self-pay

## 2020-12-13 DIAGNOSIS — S93401A Sprain of unspecified ligament of right ankle, initial encounter: Secondary | ICD-10-CM | POA: Insufficient documentation

## 2020-12-13 DIAGNOSIS — S82891A Other fracture of right lower leg, initial encounter for closed fracture: Secondary | ICD-10-CM | POA: Insufficient documentation

## 2020-12-13 DIAGNOSIS — W132XXA Fall from, out of or through roof, initial encounter: Secondary | ICD-10-CM | POA: Insufficient documentation

## 2020-12-13 DIAGNOSIS — J45909 Unspecified asthma, uncomplicated: Secondary | ICD-10-CM | POA: Insufficient documentation

## 2020-12-13 DIAGNOSIS — F17210 Nicotine dependence, cigarettes, uncomplicated: Secondary | ICD-10-CM | POA: Insufficient documentation

## 2020-12-13 MED ORDER — HYDROCODONE-ACETAMINOPHEN 5-325 MG PO TABS
1.0000 | ORAL_TABLET | Freq: Once | ORAL | Status: AC
  Administered 2020-12-13: 1 via ORAL
  Filled 2020-12-13: qty 1

## 2020-12-13 MED ORDER — IBUPROFEN 600 MG PO TABS
600.0000 mg | ORAL_TABLET | Freq: Four times a day (QID) | ORAL | 0 refills | Status: AC | PRN
  Filled 2020-12-13: qty 30, 8d supply, fill #0

## 2020-12-13 NOTE — ED Triage Notes (Signed)
Reports she was exploring the attic early this morning when she fell through to the floor below.  C/o pain to right ankle.

## 2020-12-13 NOTE — Discharge Instructions (Signed)
X-rays do not show any clear evidence of fracture.  We suspect that you either have a high-grade ankle sprain or a small fracture that was missed on the x-rays.  You have agreed to being treated aggressively, which entails putting you in a splint and have you not put any weight on your leg.  Please follow-up with the orthopedic doctor by calling the number and setting up an appointment in 7 to 10 days.  Additionally, take ibuprofen every 6-8 hours for pain control.  You can take Tylenol in between.  Ensure that the leg is elevated.

## 2020-12-13 NOTE — ED Notes (Signed)
Peanut Butter crackers and ginger ale provided

## 2020-12-13 NOTE — ED Provider Notes (Signed)
[facility] EMERGENCY DEPT Provider Note   CSN: [id number] Arrival date & time: 12/13/20  1457     History Chief Complaint  Patient presents with   Fall   Ankle Injury    Christina David is a 30 y.o. female.  HPI    30 year old female comes in with chief complaint of fall.  Patient reports that at 4 AM, she fell from her attic.  She landed on her feet and is having pain over the right ankle.  Pain with any kind of movement and she is not able to bear weight.  Patient denies any head trauma, headaches, nausea, loss of consciousness, vision changes, focal numbness or tingling (besides toes 3 4 and 5 distally), weakness.  No neck pain.  Patient is not on any blood thinners.  She is sure that she is not pregnant.  Past Medical History:  Diagnosis Date   Asthma    Depression    Mania (HCC)    Mental disorder    Never had Md diagnosis    Patient Active Problem List   Diagnosis Date Noted   Pap smear of cervix shows high risk HPV present 10/04/2020   Eczema 08/31/2020   Long term current use of antipsychotic medication 08/31/2020   Insomnia due to other mental disorder 03/14/2020   Bipolar disorder, current episode mixed, moderate (HCC) 02/08/2020   Episode of recurrent major depressive disorder (HCC) 02/08/2020   Anxiety state 02/08/2020   Cocaine abuse with cocaine-induced mood disorder (HCC) 08/21/2017    Past Surgical History:  Procedure Laterality Date   TONSILECTOMY, ADENOIDECTOMY, BILATERAL MYRINGOTOMY AND TUBES     UMBILICAL HERNIA REPAIR       OB History     Gravida  0   Para      Term      Preterm      AB      Living         SAB      IAB      Ectopic      Multiple      Live Births              Family History  Problem Relation Age of Onset   Hypertension Mother     Social History   Tobacco Use   Smoking status: Every Day    Packs/day: 0.25    Types: Cigarettes   Smokeless tobacco: Never  Substance Use  Topics   Alcohol use: No   Drug use: Yes    Types: Marijuana    Comment: states does not smoke it anymore    Home Medications Prior to Admission medications   Medication Sig Start Date End Date Taking? Authorizing Provider  ibuprofen (ADVIL) 600 MG tablet Take 1 tablet (600 mg total) by mouth every 6 (six) hours as needed for moderate pain. 12/13/20  Yes Derwood Kaplan, MD  albuterol (VENTOLIN HFA) 108 (90 Base) MCG/ACT inhaler Inhale 2 puffs into the lungs every 6 (six) hours as needed for wheezing or shortness of breath. 11/01/20   Claiborne Rigg, NP  ARIPiprazole (ABILIFY) 20 MG tablet Take 1 tablet (20 mg total) by mouth daily. 11/17/20 11/17/21  Nwoko, Tommas Olp, PA  cephALEXin (KEFLEX) 500 MG capsule Take 1 capsule (500 mg total) by mouth 4 (four) times daily. 10/23/20   Terrilee Files, MD  cetirizine (ZYRTEC ALLERGY) 10 MG tablet Take 1 tablet (10 mg total) by mouth daily. 08/31/20   Mayers, Kasandra Knudsen, PA-C  FLUoxetine (PROZAC) 20 MG capsule Take 3 capsules (60 mg total) by mouth daily. 11/17/20 11/17/21  Nwoko, Tommas Olp, PA  fluticasone (FLONASE) 50 MCG/ACT nasal spray Place 2 sprays into both nostrils daily. 11/01/20   Claiborne Rigg, NP  hydrOXYzine (ATARAX/VISTARIL) 50 MG tablet Take 1 tablet (50 mg total) by mouth every 6 (six) hours as needed for anxiety. 11/17/20   Nwoko, Tommas Olp, PA  phenazopyridine (PYRIDIUM) 200 MG tablet Take 1 tablet (200 mg total) by mouth 3 (three) times daily. 10/23/20   Terrilee Files, MD  traZODone (DESYREL) 100 MG tablet Take 1 tablet (100 mg total) by mouth at bedtime. 11/17/20 11/17/21  Nwoko, Tommas Olp, PA  triamcinolone cream (KENALOG) 0.1 % Apply 1 application topically 2 (two) times daily. 08/31/20   Mayers, Cari S, PA-C    Allergies    Lamictal [lamotrigine], Bee venom, and Trileptal [oxcarbazepine]  Review of Systems   Review of Systems  Constitutional:  Positive for activity change.  Respiratory:  Negative for shortness of breath.   Cardiovascular:   Negative for chest pain.  Musculoskeletal:  Positive for arthralgias and myalgias.  Neurological:  Positive for numbness. Negative for headaches.  Hematological:  Does not bruise/bleed easily.   Physical Exam Updated Vital Signs Ht 5\' 6"  (1.676 m)   Wt 86.2 kg   LMP 11/22/2020   BMI 30.67 kg/m   Physical Exam Vitals and nursing note reviewed.  Constitutional:      Appearance: She is well-developed.  HENT:     Head: Atraumatic.  Cardiovascular:     Rate and Rhythm: Normal rate.  Pulmonary:     Effort: Pulmonary effort is normal.  Musculoskeletal:        General: Swelling and tenderness present. No deformity.     Cervical back: Normal range of motion and neck supple.     Comments: Edema over the right lateral malleoli with point tenderness  Skin:    General: Skin is warm and dry.  Neurological:     Mental Status: She is alert and oriented to person, place, and time.    ED Results / Procedures / Treatments   Labs (all labs ordered are listed, but only abnormal results are displayed) Labs Reviewed - No data to display  EKG None  Radiology DG Ankle Complete Right  Result Date: 12/13/2020 CLINICAL DATA:  ankle pain EXAM: RIGHT ANKLE - COMPLETE 3 VIEW COMPARISON:  None. FINDINGS: Normal alignment. No acute fracture. Normal mineralization. Mild soft tissue swelling along the lateral malleolus. No joint effusion. IMPRESSION: Mild soft tissue swelling along the lateral malleolus. No malalignment or acute fracture. Electronically Signed   By: 12/15/2020 M.D.   On: 12/13/2020 16:31    Procedures Procedures   Medications Ordered in ED Medications  HYDROcodone-acetaminophen (NORCO/VICODIN) 5-325 MG per tablet 1 tablet (1 tablet Oral Given 12/13/20 1625)    ED Course  I have reviewed the triage vital signs and the nursing notes.  Pertinent labs & imaging results that were available during my care of the patient were reviewed by me and considered in my medical decision  making (see chart for details).    MDM Rules/Calculators/A&P                           30 year old female comes in after having a fall about 12 hours prior to ED arrival.  Clinical history is not concerning for traumatic brain injury or cervical spine injury.  I feel comfortable clearing dose clinically.  Patient has no chest pain, shortness of breath and there is no hypoxia.  Her primary issue is the ankle injury which appears to be because of likely a fibular fracture versus high-grade sprain.  X-rays were ordered, no clear evidence of fracture but there is an area of possible avulsion fracture over the distal fibula.  Patient is not able to bear any weight.  Had a conversation about aggressive versus conservative approach, with the aggressive approach requiring posterior splint and no weightbearing versus conservative approach of air splint with weightbearing as tolerated.  Patient prefers the former.  She will be provided with orthopedic service follow-up, and we have advised follow-up in 7 to 14 days.  NSAIDs for treatment along with rest of the RICE.  Final Clinical Impression(s) / ED Diagnoses Final diagnoses:  Closed fracture of right ankle, initial encounter  Sprain of right ankle, unspecified ligament, initial encounter    Rx / DC Orders ED Discharge Orders          Ordered    ibuprofen (ADVIL) 600 MG tablet  Every 6 hours PRN        12/13/20 1653             Derwood Kaplan, MD 12/13/20 1721

## 2020-12-20 ENCOUNTER — Ambulatory Visit: Payer: Medicaid Other | Admitting: Nurse Practitioner

## 2020-12-20 ENCOUNTER — Other Ambulatory Visit: Payer: Self-pay

## 2021-01-05 ENCOUNTER — Encounter (HOSPITAL_COMMUNITY): Payer: No Payment, Other | Admitting: Physician Assistant

## 2021-01-08 ENCOUNTER — Other Ambulatory Visit: Payer: Self-pay

## 2021-01-15 ENCOUNTER — Other Ambulatory Visit: Payer: Self-pay

## 2021-01-16 ENCOUNTER — Ambulatory Visit (HOSPITAL_COMMUNITY): Payer: No Payment, Other | Admitting: Clinical

## 2021-02-12 ENCOUNTER — Other Ambulatory Visit: Payer: Self-pay

## 2021-02-19 ENCOUNTER — Other Ambulatory Visit: Payer: Self-pay

## 2021-02-22 ENCOUNTER — Other Ambulatory Visit: Payer: Self-pay

## 2021-05-06 ENCOUNTER — Emergency Department (HOSPITAL_BASED_OUTPATIENT_CLINIC_OR_DEPARTMENT_OTHER)
Admission: EM | Admit: 2021-05-06 | Discharge: 2021-05-06 | Disposition: A | Discharge status: Home / Self Care | Payer: Medicaid Other | Attending: Emergency Medicine | Admitting: Emergency Medicine

## 2021-05-06 ENCOUNTER — Encounter (HOSPITAL_BASED_OUTPATIENT_CLINIC_OR_DEPARTMENT_OTHER): Payer: Self-pay | Admitting: *Deleted

## 2021-05-06 ENCOUNTER — Other Ambulatory Visit: Payer: Self-pay

## 2021-05-06 DIAGNOSIS — R000 Tachycardia, unspecified: Secondary | ICD-10-CM | POA: Insufficient documentation

## 2021-05-06 DIAGNOSIS — R21 Rash and other nonspecific skin eruption: Secondary | ICD-10-CM

## 2021-05-06 DIAGNOSIS — N898 Other specified noninflammatory disorders of vagina: Secondary | ICD-10-CM | POA: Insufficient documentation

## 2021-05-06 DIAGNOSIS — L309 Dermatitis, unspecified: Secondary | ICD-10-CM

## 2021-05-06 DIAGNOSIS — R109 Unspecified abdominal pain: Secondary | ICD-10-CM

## 2021-05-06 DIAGNOSIS — Z79899 Other long term (current) drug therapy: Secondary | ICD-10-CM | POA: Insufficient documentation

## 2021-05-06 DIAGNOSIS — J45909 Unspecified asthma, uncomplicated: Secondary | ICD-10-CM | POA: Insufficient documentation

## 2021-05-06 DIAGNOSIS — R1032 Left lower quadrant pain: Secondary | ICD-10-CM | POA: Insufficient documentation

## 2021-05-06 DIAGNOSIS — Z7951 Long term (current) use of inhaled steroids: Secondary | ICD-10-CM | POA: Insufficient documentation

## 2021-05-06 LAB — WET PREP, GENITAL
Clue Cells Wet Prep HPF POC: NONE SEEN
Sperm: NONE SEEN
Trich, Wet Prep: NONE SEEN
WBC, Wet Prep HPF POC: <10 (ref <10)
Yeast Wet Prep HPF POC: NONE SEEN

## 2021-05-06 LAB — URINALYSIS, ROUTINE W REFLEX MICROSCOPIC
Bilirubin Urine: NEGATIVE
Glucose, UA: NEGATIVE mg/dL
Hgb urine dipstick: NEGATIVE
Ketones, ur: NEGATIVE mg/dL
Leukocytes,Ua: NEGATIVE
Nitrite: NEGATIVE
Protein, ur: 30 mg/dL — AB
Specific Gravity, Urine: 1.032 — ABNORMAL HIGH (ref 1.005–1.030)
pH: 6 (ref 5.0–8.0)

## 2021-05-06 LAB — CBC WITH DIFFERENTIAL/PLATELET
Abs Immature Granulocytes: 0.02 10*3/uL (ref 0.00–0.07)
Basophils Absolute: 0 10*3/uL (ref 0.0–0.1)
Basophils Relative: 0 %
Eosinophils Absolute: 0.2 10*3/uL (ref 0.0–0.5)
Eosinophils Relative: 3 %
HCT: 38 % (ref 36.0–46.0)
Hemoglobin: 12.3 g/dL (ref 12.0–15.0)
Immature Granulocytes: 0 %
Lymphocytes Relative: 36 %
Lymphs Abs: 2.6 10*3/uL (ref 0.7–4.0)
MCH: 30.6 pg (ref 26.0–34.0)
MCHC: 32.4 g/dL (ref 30.0–36.0)
MCV: 94.5 fL (ref 80.0–100.0)
Monocytes Absolute: 0.6 10*3/uL (ref 0.1–1.0)
Monocytes Relative: 8 %
Neutro Abs: 3.9 10*3/uL (ref 1.7–7.7)
Neutrophils Relative %: 53 %
Platelets: 343 10*3/uL (ref 150–400)
RBC: 4.02 MIL/uL (ref 3.87–5.11)
RDW: 13.6 % (ref 11.5–15.5)
WBC: 7.4 10*3/uL (ref 4.0–10.5)
nRBC: 0 % (ref 0.0–0.2)

## 2021-05-06 LAB — COMPREHENSIVE METABOLIC PANEL
ALT: 25 U/L (ref 0–44)
AST: 25 U/L (ref 15–41)
Albumin: 4.2 g/dL (ref 3.5–5.0)
Alkaline Phosphatase: 66 U/L (ref 38–126)
Anion gap: 9 (ref 5–15)
BUN: 7 mg/dL (ref 6–20)
CO2: 24 mmol/L (ref 22–32)
Calcium: 9.4 mg/dL (ref 8.9–10.3)
Chloride: 104 mmol/L (ref 98–111)
Creatinine, Ser: 0.82 mg/dL (ref 0.44–1.00)
GFR, Estimated: >60 mL/min (ref >60)
Glucose, Bld: 94 mg/dL (ref 70–99)
Potassium: 4 mmol/L (ref 3.5–5.1)
Sodium: 137 mmol/L (ref 135–145)
Total Bilirubin: 0.4 mg/dL (ref 0.3–1.2)
Total Protein: 8.4 g/dL — ABNORMAL HIGH (ref 6.5–8.1)

## 2021-05-06 LAB — RAPID URINE DRUG SCREEN, HOSP PERFORMED
Amphetamines: POSITIVE — AB
Barbiturates: NOT DETECTED
Benzodiazepines: NOT DETECTED
Cocaine: POSITIVE — AB
Opiates: NOT DETECTED
Tetrahydrocannabinol: POSITIVE — AB

## 2021-05-06 LAB — PREGNANCY, URINE: Preg Test, Ur: NEGATIVE

## 2021-05-06 MED ORDER — TRIAMCINOLONE ACETONIDE 0.1 % EX CREA
1.0000 "application " | TOPICAL_CREAM | Freq: Two times a day (BID) | CUTANEOUS | 0 refills | Status: DC
  Filled 2021-05-06: qty 453.6, 15d supply, fill #0
  Filled 2021-05-25 – 2021-07-17 (×2): qty 453.6, 30d supply, fill #0

## 2021-05-06 NOTE — ED Triage Notes (Addendum)
Pt. States she is here for an "obgyn check"States she is having abd pain that started one week ago. States she is "spotting" last menstrual cycle was Feb 5th. C/o of nausea. Denies any recent vomiting.

## 2021-05-06 NOTE — ED Notes (Signed)
Pt verbalizes understanding of discharge instructions. Opportunity for questioning and answers were provided. Pt discharged from ED to home.   ? ?

## 2021-05-06 NOTE — ED Provider Notes (Addendum)
[facility] EMERGENCY DEPT Provider Note  CSN: [id number] Arrival date & time: 05/06/21 0426  Chief Complaint(s) Abdominal Pain  HPI Christina David is a 31 y.o. female with a past medical history listed below who presents to the emergency department with intermittent lower abdominal cramping for 1 week.  Usually brought on by activity.  Completely resolves. Will have 2-3 episodes per day.  Reports that her last menstrual cycle was February 5 and began spotting again a few days ago.  She is endorsing vaginal discharge.  Endorses sexual activity.  Wants to make sure she does not have an STD.  Denies any urinary symptoms.   The history is provided by the patient.   Past Medical History Past Medical History:  Diagnosis Date   Asthma    Depression    Mania ([place])    Mental disorder    Never had Md diagnosis   Patient Active Problem List   Diagnosis Date Noted   Pap smear of cervix shows high risk HPV present 10/04/2020   Eczema 08/31/2020   Long term current use of antipsychotic medication 08/31/2020   Insomnia due to other mental disorder 03/14/2020   Bipolar disorder, current episode mixed, moderate ([place]) 02/08/2020   Episode of recurrent major depressive disorder ([place]) 02/08/2020   Anxiety state 02/08/2020   Cocaine abuse with cocaine-induced mood disorder ([place]) 08/21/2017   Home Medication(s) Prior to Admission medications   Medication Sig Start Date End Date Taking? Authorizing Provider  albuterol (VENTOLIN HFA) 108 (90 Base) MCG/ACT inhaler Inhale 2 puffs into the lungs every 6 (six) hours as needed for wheezing or shortness of breath. 11/01/20   Gildardo Pounds, NP  ARIPiprazole (ABILIFY) 20 MG tablet Take 1 tablet (20 mg total) by mouth daily. 11/17/20 11/17/21  Nwoko, Terese Door, PA  cetirizine (ZYRTEC ALLERGY) 10 MG tablet Take 1 tablet (10 mg total) by mouth daily. 08/31/20   Mayers, Cari S, PA-C  FLUoxetine (PROZAC) 20 MG capsule Take 3 capsules (60 mg total)  by mouth daily. 11/17/20 11/17/21  Nwoko, Terese Door, PA  fluticasone (FLONASE) 50 MCG/ACT nasal spray Place 2 sprays into both nostrils daily. 11/01/20   Gildardo Pounds, NP  hydrOXYzine (ATARAX) 50 MG tablet Take 1 tablet (50 mg total) by mouth every 6 (six) hours as needed for anxiety. 11/17/20   Nwoko, Terese Door, PA  ibuprofen (ADVIL) 600 MG tablet Take 1 tablet (600 mg total) by mouth every 6 (six) hours as needed for moderate pain. 12/13/20   Varney Biles, MD  phenazopyridine (PYRIDIUM) 200 MG tablet Take 1 tablet (200 mg total) by mouth 3 (three) times daily. 10/23/20   Hayden Rasmussen, MD  traZODone (DESYREL) 100 MG tablet Take 1 tablet (100 mg total) by mouth at bedtime. 11/17/20 11/17/21  Nwoko, Terese Door, PA  triamcinolone cream (KENALOG) 0.1 % Apply 1 application topically 2 (two) times daily. 05/06/21   Fatima Blank, MD  Allergies Lamictal [lamotrigine], Bee venom, and Trileptal [oxcarbazepine]  Review of Systems Review of Systems As noted in HPI  Physical Exam Vital Signs  I have reviewed the triage vital signs BP (!) 139/108    Pulse 98    Temp 97.7 F (36.5 C) (Oral)    Resp 18    Ht 5\' 5"  (1.651 m)    Wt 102.1 kg    SpO2 100%    BMI 37.44 kg/m   Physical Exam Vitals reviewed. Exam conducted with a chaperone present.  Constitutional:      General: She is not in acute distress.    Appearance: She is well-developed. She is not diaphoretic.  HENT:     Head: Normocephalic and atraumatic.     Right Ear: External ear normal.     Left Ear: External ear normal.     Nose: Nose normal.  Eyes:     General: No scleral icterus.    Conjunctiva/sclera: Conjunctivae normal.  Neck:     Trachea: Phonation normal.  Cardiovascular:     Rate and Rhythm: Regular rhythm. Tachycardia present.  Pulmonary:     Effort: Pulmonary effort is normal. No  respiratory distress.     Breath sounds: No stridor.  Abdominal:     General: There is no distension.     Tenderness: There is abdominal tenderness in the suprapubic area and left lower quadrant. There is no guarding or rebound.  Genitourinary:    Vagina: No vaginal discharge, erythema, tenderness, bleeding or lesions.     Cervix: No cervical motion tenderness, discharge, friability, erythema or cervical bleeding.     Uterus: Not tender.      Adnexa:        Right: No tenderness.         Left: No tenderness.    Musculoskeletal:        General: Normal range of motion.     Cervical back: Normal range of motion.  Neurological:     Mental Status: She is alert and oriented to person, place, and time.  Psychiatric:        Behavior: Behavior normal.    ED Results and Treatments Labs (all labs ordered are listed, but only abnormal results are displayed) Labs Reviewed  URINALYSIS, ROUTINE W REFLEX MICROSCOPIC - Abnormal; Notable for the following components:      Result Value   Specific Gravity, Urine 1.032 (*)    Protein, ur 30 (*)    Bacteria, UA RARE (*)    All other components within normal limits  COMPREHENSIVE METABOLIC PANEL - Abnormal; Notable for the following components:   Total Protein 8.4 (*)    All other components within normal limits  RAPID URINE DRUG SCREEN, HOSP PERFORMED - Abnormal; Notable for the following components:   Cocaine POSITIVE (*)    Amphetamines POSITIVE (*)    Tetrahydrocannabinol POSITIVE (*)    All other components within normal limits  WET PREP, GENITAL  PREGNANCY, URINE  CBC WITH DIFFERENTIAL/PLATELET  GC/CHLAMYDIA PROBE AMP ([place]) NOT AT [facility]  EKG  EKG Interpretation  Date/Time:  Sunday May 06 2021 05:39:03 EST Ventricular Rate:  104 PR Interval:  133 QRS Duration: 86 QT Interval:  354 QTC Calculation: 466 R  Axis:   69 Text Interpretation: Sinus tachycardia Minimal ST depression, inferior leads Confirmed by ,  ([id number]) on 05/06/2021 6:12:06 AM       Radiology No results found.  Pertinent labs & imaging results that were available during my care of the patient were reviewed by me and considered in my medical decision making (see MDM for details).  Medications Ordered in ED Medications - No data to display                                                                                                                                   Procedures Procedures  (including critical care time)  Medical Decision Making / ED Course        Lower abdominal cramping We will assess for any pregnancy related process, cervicitis/PID, urinary tract infection.  Low suspicion for serious intra-abdominal inflammatory/infectious process such as appendicitis given the intermittent cramping features.  Exam was not concerning for cervicitis or PID.  Swabs sent. Patient noted to be tachycardic in triage.  She has a history of cocaine use but denies any recent illicit drug use of any sort.  Work-up ordered to assess concerns above.  Labs and swabs independently interpreted by me and noted below: CBC without leukocytosis or anemia No significant electrolyte derangements or renal sufficiency Urine pregnancy negative UA without evidence of infection UDS is positive for cocaine, amphetamines and THC Wet prep negative for bacterial vaginosis or trichomonas  Management: Monitored  Reassessment: Patient reported resolution of symptoms without intervention. No need for empiric STD treatment at this time we will await the GC/chlamydia swabs She then brought up having a rash consistent with previously diagnosed eczema that comes and goes.  She does have papular rash on her back. We will refill her triamcinolone cream.  Final Clinical Impression(s) / ED Diagnoses Final diagnoses:  Abdominal cramping   Rash   The patient appears reasonably screened and/or stabilized for discharge and I doubt any other medical condition or other EMC requiring further screening, evaluation, or treatment in the ED at this time prior to discharge. Safe for discharge with strict return precautions.  Disposition: Discharge  Condition: Good  I have discussed the results, Dx and Tx plan with the patient/family who expressed understanding and agree(s) with the plan. Discharge instructions discussed at length. The patient/family was given strict return precautions who verbalized understanding of the instructions. No further questions at time of discharge.    ED Discharge Orders          Ordered    triamcinolone cream (KENALOG) 0.1 %  2 times daily        02 /19/23 0559             Follow Up: Geryl Rankins  Viona Gilmore, NP [place] [id number] [phone]  Call             This chart was dictated using voice recognition software.  Despite best efforts to proofread,  errors can occur which can change the documentation meaning.    Fatima Blank, MD 05/06/21 [id number]    Fatima Blank, MD 05/06/21 [phone]

## 2021-05-07 ENCOUNTER — Other Ambulatory Visit: Payer: Self-pay

## 2021-05-07 LAB — GC/CHLAMYDIA PROBE AMP (~~LOC~~) NOT AT ARMC
Chlamydia: NEGATIVE
Comment: NEGATIVE
Comment: NORMAL
Neisseria Gonorrhea: NEGATIVE

## 2021-05-14 ENCOUNTER — Other Ambulatory Visit: Payer: Self-pay

## 2021-05-25 ENCOUNTER — Other Ambulatory Visit (HOSPITAL_COMMUNITY): Payer: Self-pay | Admitting: Physician Assistant

## 2021-05-25 ENCOUNTER — Other Ambulatory Visit: Payer: Self-pay

## 2021-05-25 DIAGNOSIS — F411 Generalized anxiety disorder: Secondary | ICD-10-CM

## 2021-05-29 ENCOUNTER — Other Ambulatory Visit: Payer: Self-pay

## 2021-05-29 NOTE — Telephone Encounter (Signed)
Patient must follow up with provider in order for medication to be prescribed.

## 2021-06-01 ENCOUNTER — Other Ambulatory Visit: Payer: Self-pay

## 2021-07-17 ENCOUNTER — Other Ambulatory Visit (HOSPITAL_COMMUNITY): Payer: Self-pay | Admitting: Physician Assistant

## 2021-07-17 ENCOUNTER — Other Ambulatory Visit: Payer: Self-pay

## 2021-07-17 ENCOUNTER — Other Ambulatory Visit (HOSPITAL_COMMUNITY): Payer: Self-pay

## 2021-07-17 DIAGNOSIS — F411 Generalized anxiety disorder: Secondary | ICD-10-CM

## 2021-07-25 ENCOUNTER — Other Ambulatory Visit: Payer: Self-pay

## 2021-07-25 MED ORDER — HYDROXYZINE HCL 50 MG PO TABS
50.0000 mg | ORAL_TABLET | Freq: Four times a day (QID) | ORAL | 1 refills | Status: AC | PRN
  Filled 2021-07-25: qty 100, 25d supply, fill #0

## 2021-08-01 ENCOUNTER — Other Ambulatory Visit: Payer: Self-pay

## 2023-03-09 ENCOUNTER — Ambulatory Visit (HOSPITAL_COMMUNITY)
Admission: EM | Admit: 2023-03-09 | Discharge: 2023-03-09 | Discharge status: Against Medical Advice | Payer: Medicaid Other

## 2023-03-09 NOTE — ED Notes (Signed)
Pt signed AMA form, NP Shalon Bobbitt notified.

## 2023-03-09 NOTE — Progress Notes (Signed)
   03/09/23 0359  [facility] Triage Screening (Walk-ins at [facility] only)  How Did You Hear About [place]? Family/Friend  What Is the Reason for Your Visit/Call Today? Pt presents to [facility] voluntarily, accompanied by a friend with complaint of stress, anxiety and suicidal ideation with no plan or intent. Pt reports that she was just released from prison in October and has been struggling with her mental health since her release. Per chart review, pt has history of Bipolar Disorder and anxiety. Pt is not taking psychotropic medications at this time. Pt currently denies HI,AVH.  How Long Has This Been Causing You Problems? 1-6 months  Have You Recently Had Any Thoughts About Hurting Yourself? Yes  How long ago did you have thoughts about hurting yourself? currently  Are You Planning to Commit Suicide/Harm Yourself At This time? No  Have you Recently Had Thoughts About Hurting Someone Karolee Ohs? No  Are You Planning To Harm Someone At This Time? No  Physical Abuse Denies  Verbal Abuse Denies  Sexual Abuse Denies  Exploitation of patient/patient's resources Denies  Self-Neglect Denies  Are you currently experiencing any auditory, visual or other hallucinations? Yes  Please explain the hallucinations you are currently experiencing: unable to elaborate, but states " everything" when asked to explain  Have You Used Any Alcohol or Drugs in the Past 24 Hours? Yes  How long ago did you use Drugs or Alcohol? 5pm last evening  What Did You Use and How Much? pint of vodka  Do you have any current medical co-morbidities that require immediate attention? No  Clinician description of patient physical appearance/behavior: pt entered the building vomiting and needed emesis bag, but cooperative during triage process  What Do You Feel Would Help You the Most Today? Treatment for Depression or other mood problem;Stress Management  If access to [facility] was not available, would you have sought care in the Emergency Department?  Yes  Determination of Need Routine (7 days)  Options For Referral Other: Comment;Outpatient Therapy;Medication Management

## 2023-04-28 ENCOUNTER — Other Ambulatory Visit (HOSPITAL_BASED_OUTPATIENT_CLINIC_OR_DEPARTMENT_OTHER): Payer: Self-pay

## 2023-04-28 ENCOUNTER — Emergency Department (HOSPITAL_BASED_OUTPATIENT_CLINIC_OR_DEPARTMENT_OTHER)
Admission: EM | Admit: 2023-04-28 | Discharge: 2023-04-28 | Disposition: A | Discharge status: Home / Self Care | Payer: Medicaid Other | Attending: Emergency Medicine | Admitting: Emergency Medicine

## 2023-04-28 ENCOUNTER — Other Ambulatory Visit: Payer: Self-pay

## 2023-04-28 ENCOUNTER — Encounter (HOSPITAL_BASED_OUTPATIENT_CLINIC_OR_DEPARTMENT_OTHER): Payer: Self-pay | Admitting: Emergency Medicine

## 2023-04-28 DIAGNOSIS — W448XXA Other foreign body entering into or through a natural orifice, initial encounter: Secondary | ICD-10-CM | POA: Insufficient documentation

## 2023-04-28 DIAGNOSIS — T161XXA Foreign body in right ear, initial encounter: Secondary | ICD-10-CM | POA: Diagnosis present

## 2023-04-28 DIAGNOSIS — I10 Essential (primary) hypertension: Secondary | ICD-10-CM | POA: Diagnosis not present

## 2023-04-28 DIAGNOSIS — L309 Dermatitis, unspecified: Secondary | ICD-10-CM | POA: Insufficient documentation

## 2023-04-28 MED ORDER — MORPHINE SULFATE (PF) 4 MG/ML IV SOLN
4.0000 mg | Freq: Once | INTRAVENOUS | Status: AC
  Administered 2023-04-28: 4 mg via INTRAMUSCULAR
  Filled 2023-04-28: qty 1

## 2023-04-28 MED ORDER — TRIAMCINOLONE ACETONIDE 0.5 % EX OINT
1.0000 | TOPICAL_OINTMENT | Freq: Two times a day (BID) | CUTANEOUS | 1 refills | Status: DC
  Filled 2023-04-28: qty 30, 15d supply, fill #0

## 2023-04-28 MED ORDER — TRIAMCINOLONE ACETONIDE 0.5 % EX OINT: 1.0000 | TOPICAL_OINTMENT | Freq: Two times a day (BID) | CUTANEOUS | 1 refills | Status: AC

## 2023-04-28 NOTE — ED Notes (Signed)
 DO was at bedside to talk to patient. Attempted again to remove cotton ball but unsuccessful. Patient agreeable with discharge and ENT follow up.

## 2023-04-28 NOTE — ED Triage Notes (Signed)
 In for eval of hypertension (out of BP med), paper stuck in right ear, and wants Flagyl  for BV.

## 2023-04-28 NOTE — Discharge Instructions (Addendum)
 Today you were found to have a cottonball in your right ear.  I was unable to retrieve in the emergency department.  Please call to make an appointment with ENT specialist at the cottonball removed.  Prescription sent to your pharmacy for dermatitis.  Patient was given morphine  in the emergency department for pain control while trying to remove the foreign body in the ear.  No prescriptions for narcotics were provided.

## 2023-04-28 NOTE — ED Notes (Addendum)
 DO at bedside and unable to extract cotton ball. Pt in a significant amount of pain. DO suggested IM Morphine  for pain control throughout the procedure. Patient agreeable.

## 2023-04-28 NOTE — ED Provider Notes (Signed)
 [place] [facility] AT [facility] Provider Note   CSN: [id number] Arrival date & time: 04/28/23  4098     History  Chief Complaint  Patient presents with   Hypertension    Christina David is a 33 y.o. female.  Patient is a 33 year old female with past medical history of pression, anxiety, mania, and mental disorder presenting for multiple complaints.  Patient's first concern is for foreign body in right ear.  States she was using a Q-tip and felt like she retained some of the cotton in her ear.  Denies any fevers, bleeding from the ear, abnormal ear discharge, or pain.  Patient also presents with the request for a diuretic.  She states she is never been on a diuretic before and has no prior history of congestive heart failure.  She states she feels like after starting her new job she began having bilateral upper extremity and lower extremity swelling.  Denies any shortness of breath or chest tightness.  Denies orthopnea.  Denies unilateral leg swelling, calf pain, or history of DVT or PE.  Lastly, patient presents for evaluation for eczema.  She has had eczema diagnosis in the past and has ran out of her triamcinolone  prescription.  Requesting prescription refill.  The history is provided by the patient. No language interpreter was used.  Hypertension Pertinent negatives include no chest pain, no abdominal pain and no shortness of breath.       Home Medications Prior to Admission medications   Medication Sig Start Date End Date Taking? Authorizing Provider  albuterol  (VENTOLIN  HFA) 108 (90 Base) MCG/ACT inhaler Inhale 2 puffs into the lungs every 6 (six) hours as needed for wheezing or shortness of breath. 11/01/20   Fleming, Zelda W, NP  ARIPiprazole  (ABILIFY ) 20 MG tablet Take 1 tablet (20 mg total) by mouth daily. 11/17/20 11/17/21  Nwoko, Uchenna E, PA  cetirizine  (ZYRTEC  ALLERGY) 10 MG tablet Take 1 tablet (10 mg total) by mouth daily. 08/31/20   Mayers, Cari S,  PA-C  FLUoxetine  (PROZAC ) 20 MG capsule Take 3 capsules (60 mg total) by mouth daily. 11/17/20 11/17/21  Nwoko, Uchenna E, PA  fluticasone  (FLONASE ) 50 MCG/ACT nasal spray Place 2 sprays into both nostrils daily. 11/01/20   Fleming, Zelda W, NP  hydrOXYzine  (ATARAX ) 50 MG tablet Take 1 tablet (50 mg total) by mouth every 6 (six) hours as needed for anxiety. 07/25/21   Nwoko, Uchenna E, PA  ibuprofen  (ADVIL ) 600 MG tablet Take 1 tablet (600 mg total) by mouth every 6 (six) hours as needed for moderate pain. 12/13/20   Deatra Face, MD  phenazopyridine  (PYRIDIUM ) 200 MG tablet Take 1 tablet (200 mg total) by mouth 3 (three) times daily. 10/23/20   Tonya Fredrickson, MD  traZODone  (DESYREL ) 100 MG tablet Take 1 tablet (100 mg total) by mouth at bedtime. 11/17/20 11/17/21  Nwoko, Uchenna E, PA  triamcinolone  cream (KENALOG ) 0.1 % Apply 1 application topically 2 (two) times daily. 05/06/21   Cardama, Camila Cecil, MD      Allergies    Lamictal [lamotrigine], Bee venom, and Trileptal [oxcarbazepine]    Review of Systems   Review of Systems  Constitutional:  Negative for chills and fever.  HENT:  Negative for ear pain and sore throat.   Eyes:  Negative for pain and visual disturbance.  Respiratory:  Negative for cough and shortness of breath.   Cardiovascular:  Positive for leg swelling. Negative for chest pain and palpitations.  Gastrointestinal:  Negative  for abdominal pain and vomiting.  Genitourinary:  Negative for dysuria and hematuria.  Musculoskeletal:  Negative for arthralgias and back pain.  Skin:  Positive for rash. Negative for color change.  Neurological:  Negative for seizures and syncope.  All other systems reviewed and are negative.   Physical Exam Updated Vital Signs BP (!) 129/92 (BP Location: Right Arm)   Pulse 82   Temp (!) 97.5 F (36.4 C) (Oral)   Resp 17   Ht 5\' 5"  (1.651 m)   Wt 102.1 kg   SpO2 100%   BMI 37.46 kg/m  Physical Exam Vitals and nursing note reviewed.   Constitutional:      General: She is not in acute distress.    Appearance: She is well-developed.  HENT:     Head: Normocephalic and atraumatic.     Right Ear: A foreign body is present.     Ears:     Comments: Cotton in right ear.  External ear canal nonerythematous.  No effusion of the TM. Eyes:     Conjunctiva/sclera: Conjunctivae normal.  Cardiovascular:     Rate and Rhythm: Normal rate and regular rhythm.     Heart sounds: No murmur heard. Pulmonary:     Effort: Pulmonary effort is normal. No respiratory distress.     Breath sounds: Normal breath sounds.  Abdominal:     Palpations: Abdomen is soft.     Tenderness: There is no abdominal tenderness.  Musculoskeletal:        General: No swelling.     Cervical back: Neck supple.  Skin:    General: Skin is warm and dry.     Capillary Refill: Capillary refill takes less than 2 seconds.     Findings: Rash present.       Neurological:     Mental Status: She is alert.  Psychiatric:        Mood and Affect: Mood normal.     ED Results / Procedures / Treatments   Labs (all labs ordered are listed, but only abnormal results are displayed) Labs Reviewed - No data to display  EKG None  Radiology No results found.  Procedures Procedures    Medications Ordered in ED Medications - No data to display  ED Course/ Medical Decision Making/ A&P                                 Medical Decision Making Risk Prescription drug management.   33 year old female with past medical history of pression, anxiety, mania, and mental disorder presenting for multiple complaints.    Patient's first concern is for foreign body in right ear.  Physical evaluation demonstrates cottonball in right ear.  Multiple attempts to try to remove foreign body from the right ear unsuccessful due to patient's inability to tolerate the pain.  Morphine  IM given with no improvement.  Recommended for close follow-up with ENT specialist for foreign body  removal.  Local physician provided.  After given morphine  dose patient became very tearful and told us  that she is in a group home and cannot have any abnormal drug testing.  I verified the medication with her as did her nurse twice.  Patient states she " just was not listening at that time".  I provided a note for her for her housing center.  No narcotics were prescribed.  Patient also presents with the request for a diuretic.  She states she is never been  on a diuretic before and has no prior history of congestive heart failure.  She states she feels like after starting her new job she began having bilateral upper extremity and lower extremity swelling.  Denies any shortness of breath or chest tightness.  Denies orthopnea.  Denies unilateral leg swelling, calf pain, or history of DVT or PE.  On physical exam patient has no upper extremity appreciable swelling.  Lower extremity demonstrates very mild +1 nonpitting edema.  She has a newly established appointment with her primary care physician.  I recommend routine blood work to check renal function at that time and otherwise decreasing salt intake.  Current blood pressure 129/92.  Recommend recheck blood pressure at that time as well.   Lastly, atient presents for evaluation for eczema.  She has had eczema diagnosis in the past and has ran out of her triamcinolone  prescription.  Requesting prescription refill.  Triamcinolone  prescription refilled.        Final Clinical Impression(s) / ED Diagnoses Final diagnoses:  None    Rx / DC Orders ED Discharge Orders     None         Quinn Bucco, DO 04/28/23 1610

## 2023-04-28 NOTE — ED Notes (Addendum)
 RN at bedside. Right Medication confirmed with patient and nursing student at bedside. Pt agreeable to IM Morphine  administration in right Deltoid.

## 2023-04-28 NOTE — ED Notes (Signed)
 Immediately after Morphine  administration pt asked, "what was that?" Nurses at bedside told patient it was Morphine  and  pt became tearful stating she can not have Morphine  because she is in a "sobriety program." Pt immediately requesting to be discharged. RN attempted to talk to patient about staying for removal of cotton from ear but pt reports she no longer wants to stay for that. RN informed patient if she left without the discharge paperwork it would be against medical advise and patient verbalized she was willing to wait for DO to be updated and talk to patient further,

## 2023-06-02 ENCOUNTER — Inpatient Hospital Stay: Payer: Medicaid Other | Admitting: Nurse Practitioner
